# Patient Record
Sex: Female | Born: 1935 | Race: White | Hispanic: No | Smoking: Former smoker
Health system: Southern US, Community
[De-identification: ages and names within clinical notes are randomized; demographics above are authoritative.]

## PROBLEM LIST (undated history)

## (undated) DIAGNOSIS — F0280 Dementia in other diseases classified elsewhere without behavioral disturbance: Secondary | ICD-10-CM

## (undated) DIAGNOSIS — G459 Transient cerebral ischemic attack, unspecified: Secondary | ICD-10-CM

## (undated) DIAGNOSIS — H269 Unspecified cataract: Secondary | ICD-10-CM

## (undated) DIAGNOSIS — G309 Alzheimer's disease, unspecified: Secondary | ICD-10-CM

## (undated) HISTORY — PX: EYE SURGERY: SHX253

## (undated) HISTORY — PX: ABDOMINAL HYSTERECTOMY: SHX81

---

## 2014-12-13 ENCOUNTER — Encounter (HOSPITAL_COMMUNITY): Payer: Self-pay

## 2014-12-13 ENCOUNTER — Emergency Department (HOSPITAL_COMMUNITY): Payer: Medicare Other

## 2014-12-13 ENCOUNTER — Observation Stay (HOSPITAL_COMMUNITY)
Admission: EM | Admit: 2014-12-13 | Discharge: 2014-12-14 | Disposition: A | Discharge status: Home / Self Care | Payer: Medicare Other | Attending: Internal Medicine | Admitting: Internal Medicine

## 2014-12-13 DIAGNOSIS — Z66 Do not resuscitate: Secondary | ICD-10-CM | POA: Diagnosis not present

## 2014-12-13 DIAGNOSIS — Z87891 Personal history of nicotine dependence: Secondary | ICD-10-CM | POA: Insufficient documentation

## 2014-12-13 DIAGNOSIS — F0280 Dementia in other diseases classified elsewhere without behavioral disturbance: Secondary | ICD-10-CM | POA: Diagnosis not present

## 2014-12-13 DIAGNOSIS — Z8673 Personal history of transient ischemic attack (TIA), and cerebral infarction without residual deficits: Secondary | ICD-10-CM | POA: Diagnosis not present

## 2014-12-13 DIAGNOSIS — G309 Alzheimer's disease, unspecified: Principal | ICD-10-CM | POA: Insufficient documentation

## 2014-12-13 DIAGNOSIS — R4781 Slurred speech: Secondary | ICD-10-CM

## 2014-12-13 DIAGNOSIS — F0390 Unspecified dementia without behavioral disturbance: Secondary | ICD-10-CM | POA: Diagnosis present

## 2014-12-13 HISTORY — DX: Dementia in other diseases classified elsewhere, unspecified severity, without behavioral disturbance, psychotic disturbance, mood disturbance, and anxiety: F02.80

## 2014-12-13 HISTORY — DX: Alzheimer's disease, unspecified: G30.9

## 2014-12-13 HISTORY — DX: Transient cerebral ischemic attack, unspecified: G45.9

## 2014-12-13 HISTORY — DX: Unspecified cataract: H26.9

## 2014-12-13 LAB — URINALYSIS, ROUTINE W REFLEX MICROSCOPIC
BILIRUBIN URINE: NEGATIVE
Glucose, UA: NEGATIVE mg/dL
Hgb urine dipstick: NEGATIVE
Ketones, ur: NEGATIVE mg/dL
NITRITE: NEGATIVE
PROTEIN: NEGATIVE mg/dL
Specific Gravity, Urine: 1.003 — ABNORMAL LOW (ref 1.005–1.030)
UROBILINOGEN UA: 0.2 mg/dL (ref 0.0–1.0)
pH: 7 (ref 5.0–8.0)

## 2014-12-13 LAB — URINE MICROSCOPIC-ADD ON

## 2014-12-13 LAB — CBG MONITORING, ED
Glucose-Capillary: 123 mg/dL — ABNORMAL HIGH (ref 65–99)
Glucose-Capillary: 85 mg/dL (ref 65–99)

## 2014-12-13 NOTE — ED Notes (Addendum)
[facility] brings pt in bc daughter called 911 d/t slurred speech and pt stating her "head feeling funny."  [facility] took FSBG and it was 57, gave a half an amp of D50 and her sugar came up to 210 and her speech became much more clear.  Per daughter, her speech is still not clear completely.  Pt is alert to place and person, has alzheimer's, grip strength equal on both sides, sensation intact and equal on both and no facial droop noted.  Pt is slow to answer direct questions, but follows commands easily and readily makes jokes.  Per daughter, pt has been "off" for the past few days and has been more tired.

## 2014-12-14 ENCOUNTER — Inpatient Hospital Stay (HOSPITAL_COMMUNITY): Payer: Medicare Other

## 2014-12-14 ENCOUNTER — Encounter (HOSPITAL_COMMUNITY): Payer: Self-pay | Admitting: Internal Medicine

## 2014-12-14 DIAGNOSIS — R4781 Slurred speech: Secondary | ICD-10-CM | POA: Diagnosis not present

## 2014-12-14 DIAGNOSIS — F0391 Unspecified dementia with behavioral disturbance: Secondary | ICD-10-CM

## 2014-12-14 DIAGNOSIS — F0390 Unspecified dementia without behavioral disturbance: Secondary | ICD-10-CM | POA: Diagnosis present

## 2014-12-14 DIAGNOSIS — G309 Alzheimer's disease, unspecified: Secondary | ICD-10-CM | POA: Diagnosis not present

## 2014-12-14 LAB — GLUCOSE, CAPILLARY
GLUCOSE-CAPILLARY: 97 mg/dL (ref 65–99)
GLUCOSE-CAPILLARY: 116 mg/dL — AB (ref 65–99)
Glucose-Capillary: 78 mg/dL (ref 65–99)

## 2014-12-14 LAB — CBC WITH DIFFERENTIAL/PLATELET
BASOS PCT: 0 % (ref 0–1)
Basophils Absolute: 0 10*3/uL (ref 0.0–0.1)
EOS ABS: 0.4 10*3/uL (ref 0.0–0.7)
Eosinophils Relative: 5 % (ref 0–5)
HCT: 39.2 % (ref 36.0–46.0)
HEMOGLOBIN: 12.3 g/dL (ref 12.0–15.0)
LYMPHS ABS: 2.5 10*3/uL (ref 0.7–4.0)
Lymphocytes Relative: 29 % (ref 12–46)
MCH: 27.2 pg (ref 26.0–34.0)
MCHC: 31.4 g/dL (ref 30.0–36.0)
MCV: 86.7 fL (ref 78.0–100.0)
Monocytes Absolute: 0.9 10*3/uL (ref 0.1–1.0)
Monocytes Relative: 11 % (ref 3–12)
NEUTROS ABS: 4.6 10*3/uL (ref 1.7–7.7)
NEUTROS PCT: 55 % (ref 43–77)
Platelets: 248 10*3/uL (ref 150–400)
RBC: 4.52 MIL/uL (ref 3.87–5.11)
RDW: 14.4 % (ref 11.5–15.5)
WBC: 8.5 10*3/uL (ref 4.0–10.5)

## 2014-12-14 LAB — CREATININE, SERUM
Creatinine, Ser: 0.73 mg/dL (ref 0.44–1.00)
GFR calc non Af Amer: >60 mL/min (ref >60)

## 2014-12-14 LAB — CBC
HCT: 42.2 % (ref 36.0–46.0)
Hemoglobin: 13.2 g/dL (ref 12.0–15.0)
MCH: 27.3 pg (ref 26.0–34.0)
MCHC: 31.3 g/dL (ref 30.0–36.0)
MCV: 87.4 fL (ref 78.0–100.0)
PLATELETS: 283 10*3/uL (ref 150–400)
RBC: 4.83 MIL/uL (ref 3.87–5.11)
RDW: 14.4 % (ref 11.5–15.5)
WBC: 8 10*3/uL (ref 4.0–10.5)

## 2014-12-14 LAB — AMMONIA: AMMONIA: 45 umol/L — AB (ref 9–35)

## 2014-12-14 LAB — BASIC METABOLIC PANEL
ANION GAP: 7 (ref 5–15)
BUN: 5 mg/dL — ABNORMAL LOW (ref 6–20)
CALCIUM: 8.9 mg/dL (ref 8.9–10.3)
CO2: 28 mmol/L (ref 22–32)
CREATININE: 0.74 mg/dL (ref 0.44–1.00)
Chloride: 104 mmol/L (ref 101–111)
GFR calc Af Amer: >60 mL/min (ref >60)
Glucose, Bld: 100 mg/dL — ABNORMAL HIGH (ref 65–99)
Potassium: 3.9 mmol/L (ref 3.5–5.1)
Sodium: 139 mmol/L (ref 135–145)

## 2014-12-14 MED ORDER — GABAPENTIN 100 MG PO CAPS
100.0000 mg | ORAL_CAPSULE | Freq: Two times a day (BID) | ORAL | Status: DC
  Administered 2014-12-14: 100 mg via ORAL
  Filled 2014-12-14: qty 1

## 2014-12-14 MED ORDER — ASPIRIN EC 81 MG PO TBEC
81.0000 mg | DELAYED_RELEASE_TABLET | Freq: Every day | ORAL | Status: DC
  Administered 2014-12-14: 81 mg via ORAL
  Filled 2014-12-14: qty 1

## 2014-12-14 MED ORDER — DIPHENHYDRAMINE HCL 50 MG/ML IJ SOLN
25.0000 mg | Freq: Once | INTRAMUSCULAR | Status: AC
  Administered 2014-12-14: 25 mg via INTRAVENOUS
  Filled 2014-12-14: qty 1

## 2014-12-14 MED ORDER — ENOXAPARIN SODIUM 40 MG/0.4ML ~~LOC~~ SOLN
40.0000 mg | Freq: Every day | SUBCUTANEOUS | Status: DC
  Administered 2014-12-14: 40 mg via SUBCUTANEOUS
  Filled 2014-12-14: qty 0.4

## 2014-12-14 MED ORDER — SODIUM CHLORIDE 0.9 % IV SOLN
INTRAVENOUS | Status: DC
  Administered 2014-12-14: 04:00:00 via INTRAVENOUS

## 2014-12-14 MED ORDER — CITALOPRAM HYDROBROMIDE 10 MG PO TABS
10.0000 mg | ORAL_TABLET | Freq: Every day | ORAL | Status: DC
  Administered 2014-12-14: 10 mg via ORAL
  Filled 2014-12-14: qty 1

## 2014-12-14 NOTE — Progress Notes (Signed)
Pt arrived to 4N10. Transferred to bed no issues. Placed on tele box 4N10. Vitals stable. Will continue to monitor.

## 2014-12-14 NOTE — Progress Notes (Signed)
Pt discharging at this time with her daughter taking all personal belongings. IV discontinued, dry dressing applied. Discharge instructions provided with verbal understanding. Her daughter has already scheduled follow up appt. No noted distress. Pt denies pain or discomfort.

## 2014-12-14 NOTE — Discharge Summary (Signed)
Physician Discharge Summary  Kodi Guerrera ZOX:[id number] DOB: [birth year] DOA: 12/13/2014  PCP: Ginette Otto, MD  Admit date: 12/13/2014 Discharge date: 12/14/2014  Recommendations for Outpatient Follow-up:  1.   Discharge Diagnoses:  Principal Problem:   Slurred speech Active Problems:   Dementia   Discharge Condition: stable  Diet recommendation:   Filed Weights   12/13/14 2202 12/14/14 0352  Weight: 60.328 kg (133 lb) 58.196 kg (128 lb 4.8 oz)    History of present illness:  81 y.o. female with history of advanced dementia was brought to the ER after patient was found to be having slurred speech. As per patient's daughter who provided the history patient was started on Seroquel yesterday by patient's primary care physician of the patient was found to have some agitation. Patient was given the first dose of Seroquel and 7:30 PM and by 8:30 PM patient started developing slurred speech and confusion more than baseline. EMS was called and patient was found to have blood sugar in the 50s and was given D50 despite which patient still had slurred speech and was brought to the ER. CT of the head did not show anything acute. Patient has been admitted for further management. On my exam patient is alert awake oriented to her name. Moves all extremities.   Hospital Course:  Observed on telemetry. Blood glucoses remained normal. seroquel stopped. Suspect symptoms related to seroquel and or hypoglycemia. No history of diabetes.  Procedures:  none  Consultations:  none  Discharge Exam: Filed Vitals:   12/14/14 0600  BP: 128/54  Pulse: 72  Temp: 98.3 F (36.8 C)  Resp: 18    General: a and o, cooperative Cardiovascular: RRR Respiratory: CTA Neuro nonfocal  Discharge Instructions   Discharge Instructions    Diet general    Complete by:  As directed      Walk with assistance    Complete by:  As directed           Current Discharge Medication List    CONTINUE these  medications which have NOT CHANGED   Details  aspirin EC 81 MG tablet Take 81 mg by mouth daily.    citalopram (CELEXA) 10 MG tablet Take 10 mg by mouth daily.    gabapentin (NEURONTIN) 100 MG capsule Take 100 mg by mouth 2 (two) times daily.      STOP taking these medications     QUEtiapine (SEROQUEL) 25 MG tablet        No Known Allergies    The results of significant diagnostics from this hospitalization (including imaging, microbiology, ancillary and laboratory) are listed below for reference.    Significant Diagnostic Studies: Dg Chest 2 View  12/13/2014   CLINICAL DATA:  Altered mental status.  Hypoglycemia.  EXAM: CHEST  2 VIEW  COMPARISON:  None.  FINDINGS: Cardiac enlargement without vascular congestion. Diffuse interstitial pattern to the lungs likely represent fibrosis and chronic bronchitic change. No focal airspace disease or consolidation. No blunting of costophrenic angles. No pneumothorax. Calcified and tortuous aorta. Degenerative changes in the spine and shoulders.  IMPRESSION: Cardiac enlargement. Fibrosis and chronic bronchitic changes in the lungs. No evidence of active disease.   Electronically Signed   By: Burman Nieves M.D.   On: 12/13/2014 23:49   Ct Head Wo Contrast  12/14/2014   CLINICAL DATA:  Acute onset of slurred speech. Head feeling funny. Initial encounter.  EXAM: CT HEAD WITHOUT CONTRAST  TECHNIQUE: Contiguous axial images were obtained from the base of the skull  through the vertex without intravenous contrast.  COMPARISON:  None.  FINDINGS: There is no evidence of acute infarction, mass lesion, or intra- or extra-axial hemorrhage on CT.  Prominence of the ventricles and sulci reflects moderately severe cortical volume loss. Diffuse periventricular and subcortical white matter change reflects small vessel ischemic microangiopathy. Cerebellar atrophy is noted. Chronic ischemic change is noted at the external capsule bilaterally, and a few chronic lacunar  infarcts are noted at the basal ganglia.  The brainstem and fourth ventricle are within normal limits. The cerebral hemispheres demonstrate grossly normal gray-white differentiation. No mass effect or midline shift is seen.  There is no evidence of fracture; visualized osseous structures are unremarkable in appearance. The orbits are within normal limits. The paranasal sinuses and mastoid air cells are well-aerated. No significant soft tissue abnormalities are seen.  IMPRESSION: 1. No acute intracranial pathology seen on CT. 2. Moderately severe cortical volume loss noted. Diffuse small vessel ischemic microangiopathy seen. 3. Chronic ischemic change at the external capsule bilaterally, and few chronic lacunar infarcts at the basal ganglia.   Electronically Signed   By: Roanna Raider M.D.   On: 12/14/2014 00:04   Mr Brain Wo Contrast  12/14/2014   CLINICAL DATA:  Altered mental status for a few days, tired. Hypoglycemia. History of Alzheimer's.  EXAM: MRI HEAD WITHOUT CONTRAST  TECHNIQUE: Multiplanar, multiecho pulse sequences of the brain and surrounding structures were obtained without intravenous contrast.  COMPARISON:  CT head December 13, 2014  FINDINGS: No reduced diffusion to suggest acute ischemia. No susceptibility artifact to suggest hemorrhage.  Ventricles and sulci are normal for patient's age. Confluent supratentorial white matter FLAIR T2 hyperintensities, patchy pontine T2 hyperintensities without midline shift, mass effect or mass lesions on this mildly motion degraded examination. Tiny T2 hyperintensities in the basal ganglia and thalamus favor perivascular spaces.  Symmetric mildly prominent posterior fossa extra-axial cerebral spinal fluid spaces suggests underlying atrophy, less likely chronic subdural hematoma/hygromas. Normal major intracranial vascular flow voids seen at the skull base. Status post bilateral ocular lens implants. Trace maxillary sinus mucosal thickening without air-fluid  levels. The mastoid air cells are well aerated. Moderate temporomandibular osteoarthrosis. No abnormal sellar expansion. No cerebellar tonsillar ectopia. No suspicious calvarial bone marrow signal.  IMPRESSION: No acute intracranial process on this mildly motion degraded examination.  Involutional changes. Severe white matter changes compatible chronic small vessel ischemic disease .   Electronically Signed   By: Awilda Metro M.D.   On: 12/14/2014 03:53    Microbiology: No results found for this or any previous visit (from the past 240 hour(s)).   Labs: Basic Metabolic Panel:  Recent Labs Lab 12/13/14 2350  NA 139  K 3.9  CL 104  CO2 28  GLUCOSE 100*  BUN 5*  CREATININE 0.74  CALCIUM 8.9   Liver Function Tests: No results for input(s): AST, ALT, ALKPHOS, BILITOT, PROT, ALBUMIN in the last 168 hours. No results for input(s): LIPASE, AMYLASE in the last 168 hours. No results for input(s): AMMONIA in the last 168 hours. CBC:  Recent Labs Lab 12/13/14 2350  WBC 8.5  NEUTROABS 4.6  HGB 12.3  HCT 39.2  MCV 86.7  PLT 248   Cardiac Enzymes: No results for input(s): CKTOTAL, CKMB, CKMBINDEX, TROPONINI in the last 168 hours. BNP: BNP (last 3 results) No results for input(s): BNP in the last 8760 hours.  ProBNP (last 3 results) No results for input(s): PROBNP in the last 8760 hours.  CBG:  Recent Labs Lab  12/13/14 2211 12/13/14 2352 12/14/14 0412 12/14/14 0728  GLUCAP 123* 85 78 97       Signed:  Macie Baum L  [facility] 12/14/2014, 9:33 AM

## 2014-12-14 NOTE — ED Provider Notes (Signed)
CSN: [id number]     Arrival date & time 12/13/14  2139 History   First MD Initiated Contact with Patient 12/13/14 2203     Chief Complaint  Patient presents with  . Altered Mental Status  . Hypoglycemia     (Consider location/radiation/quality/duration/timing/severity/associated sxs/prior Treatment) Patient is a 81 y.o. female presenting with altered mental status and hypoglycemia. The history is provided by the patient and a relative. No language interpreter was used.  Altered Mental Status Presenting symptoms: behavior changes   Presenting symptoms: no confusion   Presenting symptoms comment:  Slurred speech Severity:  Moderate Most recent episode:  Today Episode history:  Single Duration:  5 hours Timing:  Constant Progression:  Unchanged Context: dementia   Associated symptoms: decreased appetite and slurred speech   Associated symptoms: no abdominal pain, no agitation, no fever, no headaches, no nausea, no palpitations, no vomiting and no weakness   Associated symptoms comment:  Increased sleepiness Hypoglycemia Initial blood sugar:  57 Blood sugar after intervention:  210 Severity:  Moderate Onset quality:  Unable to specify Progression:  Improving Chronicity:  New Diabetic status:  Non-diabetic Relieved by:  IV glucose Associated symptoms: altered mental status and speech difficulty   Associated symptoms: no shortness of breath, no sweats, no vomiting and no weakness     Past Medical History  Diagnosis Date  . Alzheimer disease   . TIA (transient ischemic attack)   . Cataract    Past Surgical History  Procedure Laterality Date  . Abdominal hysterectomy    . Eye surgery     Family History  Problem Relation Age of Onset  . Dementia Neg Hx    History  Substance Use Topics  . Smoking status: Former Games developer  . Smokeless tobacco: Not on file  . Alcohol Use: Not on file   OB History    No data available     Review of Systems  Constitutional: Positive  for decreased appetite. Negative for fever, chills, diaphoresis, activity change, appetite change and fatigue.  HENT: Negative for congestion, facial swelling, rhinorrhea and sore throat.   Eyes: Negative for photophobia and discharge.  Respiratory: Negative for cough, chest tightness and shortness of breath.   Cardiovascular: Negative for chest pain, palpitations and leg swelling.  Gastrointestinal: Negative for nausea, vomiting, abdominal pain and diarrhea.  Endocrine: Negative for polydipsia and polyuria.  Genitourinary: Negative for dysuria, frequency, difficulty urinating and pelvic pain.  Musculoskeletal: Negative for back pain, arthralgias, neck pain and neck stiffness.  Skin: Negative for color change and wound.  Allergic/Immunologic: Negative for immunocompromised state.  Neurological: Positive for speech difficulty. Negative for facial asymmetry, weakness, numbness and headaches.  Hematological: Does not bruise/bleed easily.  Psychiatric/Behavioral: Negative for confusion and agitation.      Allergies  Review of patient's allergies indicates no known allergies.  Home Medications   Prior to Admission medications   Medication Sig Start Date End Date Taking? Authorizing Provider  aspirin EC 81 MG tablet Take 81 mg by mouth daily.   Yes Historical Provider, MD  citalopram (CELEXA) 10 MG tablet Take 10 mg by mouth daily.   Yes Historical Provider, MD  gabapentin (NEURONTIN) 100 MG capsule Take 100 mg by mouth 2 (two) times daily.   Yes Historical Provider, MD  QUEtiapine (SEROQUEL) 25 MG tablet Take 25 mg by mouth at bedtime.   Yes Historical Provider, MD   BP 128/54 mmHg  Pulse 72  Temp(Src) 98.3 F (36.8 C) (Oral)  Resp 18  Ht  5\' 4"  (1.626 m)  Wt 128 lb 4.8 oz (58.196 kg)  BMI 22.01 kg/m2  SpO2 98% Physical Exam  Constitutional: She is oriented to person, place, and time. She appears well-developed and well-nourished. No distress.  HENT:  Head: Normocephalic and  atraumatic.  Mouth/Throat: No oropharyngeal exudate.  Eyes: Pupils are equal, round, and reactive to light.  Neck: Normal range of motion. Neck supple.  Cardiovascular: Normal rate, regular rhythm and normal heart sounds.  Exam reveals no gallop and no friction rub.   No murmur heard. Pulmonary/Chest: Effort normal and breath sounds normal. No respiratory distress. She has no wheezes. She has no rales.  Abdominal: Soft. Bowel sounds are normal. She exhibits no distension and no mass. There is no tenderness. There is no rebound and no guarding.  Musculoskeletal: Normal range of motion. She exhibits no edema or tenderness.  Neurological: She is alert and oriented to person, place, and time. She has normal strength. She displays no tremor. No cranial nerve deficit or sensory deficit. She exhibits normal muscle tone. Coordination normal. GCS eye subscore is 4. GCS verbal subscore is 4. GCS motor subscore is 6.  Skin: Skin is warm and dry.  Psychiatric: She has a normal mood and affect.    ED Course  Procedures (including critical care time) Labs Review Labs Reviewed  URINALYSIS, ROUTINE W REFLEX MICROSCOPIC (NOT AT [facility]) - Abnormal; Notable for the following:    APPearance CLOUDY (*)    Specific Gravity, Urine 1.003 (*)    Leukocytes, UA TRACE (*)    All other components within normal limits  URINE MICROSCOPIC-ADD ON - Abnormal; Notable for the following:    Bacteria, UA FEW (*)    All other components within normal limits  BASIC METABOLIC PANEL - Abnormal; Notable for the following:    Glucose, Bld 100 (*)    BUN 5 (*)    All other components within normal limits  AMMONIA - Abnormal; Notable for the following:    Ammonia 45 (*)    All other components within normal limits  GLUCOSE, CAPILLARY - Abnormal; Notable for the following:    Glucose-Capillary 116 (*)    All other components within normal limits  CBG MONITORING, ED - Abnormal; Notable for the following:    Glucose-Capillary  123 (*)    All other components within normal limits  URINE CULTURE  CBC WITH DIFFERENTIAL/PLATELET  CBC  CREATININE, SERUM  GLUCOSE, CAPILLARY  GLUCOSE, CAPILLARY  CBG MONITORING, ED    Imaging Review Dg Chest 2 View  12/13/2014   CLINICAL DATA:  Altered mental status.  Hypoglycemia.  EXAM: CHEST  2 VIEW  COMPARISON:  None.  FINDINGS: Cardiac enlargement without vascular congestion. Diffuse interstitial pattern to the lungs likely represent fibrosis and chronic bronchitic change. No focal airspace disease or consolidation. No blunting of costophrenic angles. No pneumothorax. Calcified and tortuous aorta. Degenerative changes in the spine and shoulders.  IMPRESSION: Cardiac enlargement. Fibrosis and chronic bronchitic changes in the lungs. No evidence of active disease.   Electronically Signed   By: Burman Nieves M.D.   On: 12/13/2014 23:49   Ct Head Wo Contrast  12/14/2014   CLINICAL DATA:  Acute onset of slurred speech. Head feeling funny. Initial encounter.  EXAM: CT HEAD WITHOUT CONTRAST  TECHNIQUE: Contiguous axial images were obtained from the base of the skull through the vertex without intravenous contrast.  COMPARISON:  None.  FINDINGS: There is no evidence of acute infarction, mass lesion, or intra- or  extra-axial hemorrhage on CT.  Prominence of the ventricles and sulci reflects moderately severe cortical volume loss. Diffuse periventricular and subcortical white matter change reflects small vessel ischemic microangiopathy. Cerebellar atrophy is noted. Chronic ischemic change is noted at the external capsule bilaterally, and a few chronic lacunar infarcts are noted at the basal ganglia.  The brainstem and fourth ventricle are within normal limits. The cerebral hemispheres demonstrate grossly normal gray-white differentiation. No mass effect or midline shift is seen.  There is no evidence of fracture; visualized osseous structures are unremarkable in appearance. The orbits are within  normal limits. The paranasal sinuses and mastoid air cells are well-aerated. No significant soft tissue abnormalities are seen.  IMPRESSION: 1. No acute intracranial pathology seen on CT. 2. Moderately severe cortical volume loss noted. Diffuse small vessel ischemic microangiopathy seen. 3. Chronic ischemic change at the external capsule bilaterally, and few chronic lacunar infarcts at the basal ganglia.   Electronically Signed   By: Roanna Raider M.D.   On: 12/14/2014 00:04   Mr Brain Wo Contrast  12/14/2014   CLINICAL DATA:  Altered mental status for a few days, tired. Hypoglycemia. History of Alzheimer's.  EXAM: MRI HEAD WITHOUT CONTRAST  TECHNIQUE: Multiplanar, multiecho pulse sequences of the brain and surrounding structures were obtained without intravenous contrast.  COMPARISON:  CT head December 13, 2014  FINDINGS: No reduced diffusion to suggest acute ischemia. No susceptibility artifact to suggest hemorrhage.  Ventricles and sulci are normal for patient's age. Confluent supratentorial white matter FLAIR T2 hyperintensities, patchy pontine T2 hyperintensities without midline shift, mass effect or mass lesions on this mildly motion degraded examination. Tiny T2 hyperintensities in the basal ganglia and thalamus favor perivascular spaces.  Symmetric mildly prominent posterior fossa extra-axial cerebral spinal fluid spaces suggests underlying atrophy, less likely chronic subdural hematoma/hygromas. Normal major intracranial vascular flow voids seen at the skull base. Status post bilateral ocular lens implants. Trace maxillary sinus mucosal thickening without air-fluid levels. The mastoid air cells are well aerated. Moderate temporomandibular osteoarthrosis. No abnormal sellar expansion. No cerebellar tonsillar ectopia. No suspicious calvarial bone marrow signal.  IMPRESSION: No acute intracranial process on this mildly motion degraded examination.  Involutional changes. Severe white matter changes compatible  chronic small vessel ischemic disease .   Electronically Signed   By: Awilda Metro M.D.   On: 12/14/2014 03:53     EKG Interpretation None      MDM   Final diagnoses:  Slurred speech    Pt is a 81 y.o. female with Pmhx as above who presents with sudden onset slurred speech around 8:30PM, EMS called, found to be hypoglycemic D50 was given, but per daughter.  Speech is unchanged.  She also reports that she has been sleepy for the last couple days but that the sleepiness was worse today.  On physical exam, vital signs are stable and she is in no acute distress.  She has no focal neuro findings.  CT had normal chest x-ray has no acute findings.  Urine does not appear infected.  CBC and BMP are grossly unremarkable.  Patient's glucose here is normal.  As I consider TIA/CVA a possibility given, slurred speech, tried consulted for admission for further workup.      Toy Cookey, MD 12/14/14 [id number]

## 2014-12-14 NOTE — H&P (Signed)
[facility] History and Physical  Janet Nunez ZOX:[id number] DOB: [birth year] DOA: 12/13/2014  Referring physician: Dr.Docherty. PCP: Ginette Otto, MD  Specialists: None.  Chief Complaint: Slurred speech.  HPI: Janet Nunez is a 81 y.o. female with history of advanced dementia was brought to the ER after patient was found to be having slurred speech. As per patient's daughter who provided the history patient was started on Seroquel yesterday by patient's primary care physician of the patient was found to have some agitation. Patient was given the first dose of Seroquel and 7:30 PM and by 8:30 PM patient started developing slurred speech and confusion more than baseline. EMS was called and patient was found to have blood sugar in the 50s and was given D50 despite which patient still had slurred speech and was brought to the ER. CT of the head did not show anything acute. Patient has been admitted for further management. On my exam patient is alert awake oriented to her name. Moves all extremities.   Review of Systems: As presented in the history of presenting illness, rest negative.  Past Medical History  Diagnosis Date  . Alzheimer disease   . TIA (transient ischemic attack)   . Cataract    Past Surgical History  Procedure Laterality Date  . Abdominal hysterectomy    . Eye surgery     Social History:  reports that she has quit smoking. She does not have any smokeless tobacco history on file. Her alcohol and drug histories are not on file. Where does patient live home. Can patient participate in ADLs? No.  No Known Allergies  Family History:  Family History  Problem Relation Age of Onset  . Dementia Neg Hx       Prior to Admission medications   Medication Sig Start Date End Date Taking? Authorizing Provider  aspirin EC 81 MG tablet Take 81 mg by mouth daily.   Yes Historical Provider, MD  citalopram (CELEXA) 10 MG tablet Take 10 mg by mouth daily.   Yes Historical  Provider, MD  gabapentin (NEURONTIN) 100 MG capsule Take 100 mg by mouth 2 (two) times daily.   Yes Historical Provider, MD  QUEtiapine (SEROQUEL) 25 MG tablet Take 25 mg by mouth at bedtime.   Yes Historical Provider, MD    Physical Exam: Filed Vitals:   12/14/14 0000 12/14/14 0030 12/14/14 0115 12/14/14 0145  BP: 124/52 120/51 133/49 137/52  Pulse: 63 68 66 68  Temp:      Resp: Height:      Weight:      SpO2: 96% 94% 94% 95%     General:  Moderately built and poorly nourished.  Eyes: Anicteric no pallor.  ENT: No discharge from the ears eyes nose and mouth.  Neck: No mass felt.  Cardiovascular: S1 and S2 heard.  Respiratory: No rhonchi or crepitations.  Abdomen: Soft nontender bowel sounds present.  Skin: No rash.  Musculoskeletal: No edema.  Psychiatric: Patient has dementia.  Neurologic: Alert awake oriented to name. Moves all extremities. No facial asymmetry. PERRLA positive.  Labs on Admission:  Basic Metabolic Panel:  Recent Labs Lab 12/13/14 2350  NA 139  K 3.9  CL 104  CO2 28  GLUCOSE 100*  BUN 5*  CREATININE 0.74  CALCIUM 8.9   Liver Function Tests: No results for input(s): AST, ALT, ALKPHOS, BILITOT, PROT, ALBUMIN in the last 168 hours. No results for input(s): LIPASE, AMYLASE in the last 168 hours. No results for  input(s): AMMONIA in the last 168 hours. CBC:  Recent Labs Lab 12/13/14 2350  WBC 8.5  NEUTROABS 4.6  HGB 12.3  HCT 39.2  MCV 86.7  PLT 248   Cardiac Enzymes: No results for input(s): CKTOTAL, CKMB, CKMBINDEX, TROPONINI in the last 168 hours.  BNP (last 3 results) No results for input(s): BNP in the last 8760 hours.  ProBNP (last 3 results) No results for input(s): PROBNP in the last 8760 hours.  CBG:  Recent Labs Lab 12/13/14 2211 12/13/14 2352  GLUCAP 123* 85    Radiological Exams on Admission: Dg Chest 2 View  12/13/2014   CLINICAL DATA:  Altered mental status.  Hypoglycemia.  EXAM: CHEST   2 VIEW  COMPARISON:  None.  FINDINGS: Cardiac enlargement without vascular congestion. Diffuse interstitial pattern to the lungs likely represent fibrosis and chronic bronchitic change. No focal airspace disease or consolidation. No blunting of costophrenic angles. No pneumothorax. Calcified and tortuous aorta. Degenerative changes in the spine and shoulders.  IMPRESSION: Cardiac enlargement. Fibrosis and chronic bronchitic changes in the lungs. No evidence of active disease.   Electronically Signed   By: Burman Nieves M.D.   On: 12/13/2014 23:49   Ct Head Wo Contrast  12/14/2014   CLINICAL DATA:  Acute onset of slurred speech. Head feeling funny. Initial encounter.  EXAM: CT HEAD WITHOUT CONTRAST  TECHNIQUE: Contiguous axial images were obtained from the base of the skull through the vertex without intravenous contrast.  COMPARISON:  None.  FINDINGS: There is no evidence of acute infarction, mass lesion, or intra- or extra-axial hemorrhage on CT.  Prominence of the ventricles and sulci reflects moderately severe cortical volume loss. Diffuse periventricular and subcortical white matter change reflects small vessel ischemic microangiopathy. Cerebellar atrophy is noted. Chronic ischemic change is noted at the external capsule bilaterally, and a few chronic lacunar infarcts are noted at the basal ganglia.  The brainstem and fourth ventricle are within normal limits. The cerebral hemispheres demonstrate grossly normal gray-white differentiation. No mass effect or midline shift is seen.  There is no evidence of fracture; visualized osseous structures are unremarkable in appearance. The orbits are within normal limits. The paranasal sinuses and mastoid air cells are well-aerated. No significant soft tissue abnormalities are seen.  IMPRESSION: 1. No acute intracranial pathology seen on CT. 2. Moderately severe cortical volume loss noted. Diffuse small vessel ischemic microangiopathy seen. 3. Chronic ischemic change  at the external capsule bilaterally, and few chronic lacunar infarcts at the basal ganglia.   Electronically Signed   By: Roanna Raider M.D.   On: 12/14/2014 00:04     Assessment/Plan Principal Problem:   Slurred speech Active Problems:   Dementia   1. Slurred speech - primarily I think patient's symptoms may be related to patient's Seroquel added yesterday. MRI is pending to rule out stroke. If MRI is negative for stroke then possibly will hold off Seroquel. At this time patient's daughter does not want any very aggressive measures. Patient did have brief episode of hypoglycemia which could also have contributed to the patient's symptoms. Closely follow CBGs. 2. Dementia - will continue with her present medications. See #1 regarding Seroquel.   DVT Prophylaxis Lovenox.  Code Status: DO NOT RESUSCITATE.  Family Communication: Discussed daughter.  Disposition Plan: Admit for observation.    Saskia Simerson N. [facility] Pager [phone].  If 7PM-7AM, please contact night-coverage [email or web address] Password [facility] 12/14/2014, 3:53 AM

## 2014-12-15 LAB — URINE CULTURE

## 2015-01-09 ENCOUNTER — Encounter (HOSPITAL_COMMUNITY): Payer: Self-pay

## 2015-01-09 ENCOUNTER — Inpatient Hospital Stay (HOSPITAL_COMMUNITY)
Admission: EM | Admit: 2015-01-09 | Discharge: 2015-01-12 | DRG: 179 | Disposition: A | Discharge status: Home Health | Payer: Medicare Other | Attending: Internal Medicine | Admitting: Internal Medicine

## 2015-01-09 DIAGNOSIS — R633 Feeding difficulties: Secondary | ICD-10-CM | POA: Diagnosis present

## 2015-01-09 DIAGNOSIS — R778 Other specified abnormalities of plasma proteins: Secondary | ICD-10-CM | POA: Diagnosis present

## 2015-01-09 DIAGNOSIS — J690 Pneumonitis due to inhalation of food and vomit: Principal | ICD-10-CM | POA: Diagnosis present

## 2015-01-09 DIAGNOSIS — G309 Alzheimer's disease, unspecified: Secondary | ICD-10-CM | POA: Diagnosis present

## 2015-01-09 DIAGNOSIS — Z66 Do not resuscitate: Secondary | ICD-10-CM | POA: Diagnosis present

## 2015-01-09 DIAGNOSIS — J189 Pneumonia, unspecified organism: Secondary | ICD-10-CM

## 2015-01-09 DIAGNOSIS — Z8673 Personal history of transient ischemic attack (TIA), and cerebral infarction without residual deficits: Secondary | ICD-10-CM

## 2015-01-09 DIAGNOSIS — I7143 Infrarenal abdominal aortic aneurysm, without rupture: Secondary | ICD-10-CM | POA: Insufficient documentation

## 2015-01-09 DIAGNOSIS — Z7982 Long term (current) use of aspirin: Secondary | ICD-10-CM

## 2015-01-09 DIAGNOSIS — F0390 Unspecified dementia without behavioral disturbance: Secondary | ICD-10-CM | POA: Diagnosis present

## 2015-01-09 DIAGNOSIS — R7989 Other specified abnormal findings of blood chemistry: Secondary | ICD-10-CM | POA: Diagnosis present

## 2015-01-09 DIAGNOSIS — Z87891 Personal history of nicotine dependence: Secondary | ICD-10-CM

## 2015-01-09 DIAGNOSIS — I714 Abdominal aortic aneurysm, without rupture: Secondary | ICD-10-CM | POA: Diagnosis present

## 2015-01-09 DIAGNOSIS — Z515 Encounter for palliative care: Secondary | ICD-10-CM | POA: Insufficient documentation

## 2015-01-09 DIAGNOSIS — F0280 Dementia in other diseases classified elsewhere without behavioral disturbance: Secondary | ICD-10-CM | POA: Diagnosis present

## 2015-01-09 DIAGNOSIS — R1011 Right upper quadrant pain: Secondary | ICD-10-CM | POA: Diagnosis not present

## 2015-01-09 MED ORDER — SODIUM CHLORIDE 0.9 % IV SOLN
INTRAVENOUS | Status: DC
  Administered 2015-01-10: via INTRAVENOUS

## 2015-01-09 NOTE — ED Provider Notes (Addendum)
TIME SEEN: 11:40 PM  CHIEF COMPLAINT: Abdominal pain  HPI: Pt is a 81 y.o. female with history of Alzheimer's dementia, TIAs who presents to the emergency department with her daughter for complaints of abdominal pain that started 1 hour prior to arrival. Given patient's dementia history is very limited. She does state that pain is worse with movement and deep breast but feels better when staying still. No known history of fever, cough, chest pain or shortness of breath, nausea, vomiting or diarrhea, dysuria or hematuria. Patient is status post hysterectomy.  ROS: See HPI Constitutional: no fever  Eyes: no drainage  ENT: no runny nose   Cardiovascular:  no chest pain  Resp: no SOB  GI: no vomiting GU: no dysuria Integumentary: no rash  Allergy: no hives  Musculoskeletal: no leg swelling  Neurological: no slurred speech ROS otherwise negative  PAST MEDICAL HISTORY/PAST SURGICAL HISTORY:  Past Medical History  Diagnosis Date  . Alzheimer disease   . TIA (transient ischemic attack)   . Cataract     MEDICATIONS:  Prior to Admission medications   Medication Sig Start Date End Date Taking? Authorizing Provider  aspirin EC 81 MG tablet Take 81 mg by mouth daily.    Historical Provider, MD  citalopram (CELEXA) 10 MG tablet Take 10 mg by mouth daily.    Historical Provider, MD  gabapentin (NEURONTIN) 100 MG capsule Take 100 mg by mouth 2 (two) times daily.    Historical Provider, MD    ALLERGIES:  No Known Allergies  SOCIAL HISTORY:  History  Substance Use Topics  . Smoking status: Former Games developer  . Smokeless tobacco: Not on file  . Alcohol Use: Not on file    FAMILY HISTORY: Family History  Problem Relation Age of Onset  . Dementia Neg Hx     EXAM: BP 173/64 mmHg  Pulse 90  Temp(Src) 98.9 F (37.2 C) (Oral)  Resp 18  SpO2 93% CONSTITUTIONAL: Alert and oriented to person and answers some questions appropriately but does appear to be confused which is her baseline, in  no distress, nontoxic, afebrile, elderly HEAD: Normocephalic EYES: Conjunctivae clear, PERRL ENT: normal nose; no rhinorrhea; moist mucous membranes; pharynx without lesions noted NECK: Supple, no meningismus, no LAD  CARD: RRR; S1 and S2 appreciated; no murmurs, no clicks, no rubs, no gallops RESP: Normal chest excursion without splinting or tachypnea; breath sounds clear and equal bilaterally; no wheezes, no rhonchi, no rales, no hypoxia or respiratory distress, speaking full sentences ABD/GI: Normal bowel sounds; non-distended; soft, tender to palpation throughout the abdomen but worse in the epigastric region and right mid abdomen, no rebound, no guarding, no peritoneal signs, no significant tenderness at McBurney's point, negative Murphy sign BACK:  The back appears normal and is non-tender to palpation, there is no CVA tenderness EXT: Normal ROM in all joints; non-tender to palpation; no edema; normal capillary refill; no cyanosis, no calf tenderness or swelling    SKIN: Normal color for age and race; warm NEURO: Moves all extremities equally, sensation to light touch intact diffusely, cranial nerves II through XII intact PSYCH: The patient's mood and manner are appropriate. Grooming and personal hygiene are appropriate.  MEDICAL DECISION MAKING: Patient here with abdominal pain. History limited given her dementia. I will obtain labs, urine, CT of her abdomen and pelvis. She denies any pain medication at this time.  ED PROGRESS: Patient's labs show troponin of 1.02 in the setting of a normal creatinine. No leukocytosis and normal lactate. Repeat EKG  shows no new changes. She does have abnormal morphology of the ST segment in lead III but no ST elevation and no other signs of ischemia. Discussed with Dr. Leeann MustJacob Kelly with cardiology. Patient is a DO NOT RESUSCITATE and daughter at bedside states at this time she is not sure if she would want a cardiac catheterization. Dr. Tresa EndoKelly feels patient can  be admitted to medical service and cardiology will consult in the morning. He feels that she likely has other underlying medical issues that are causing her troponin to be elevated. She denies chest pain or shortness of breath.  2:30 AM  Pt's CT scan shows a 4.6 cm infrarenal abdominal aortic aneurysm without sign of rupture. Daughter states that patient would not want anything done about this aneurysm. There is also a right pleural effusion and consolidation in the right lung base indicative of pneumonia. This is likely the cause of her right-sided pleuritic pain. Will treat with ceftriaxone and azithromycin. Will admit.  Daughter now reports for the past week the patient has had a cough and a hoarse voice. Discussed with hospitalist, Dr. Toniann FailKakrakandy for admission to tele.     EKG Interpretation  Date/Time:  Tuesday January 09 2015 23:59:15 EDT Ventricular Rate:  86 PR Interval:  212 QRS Duration: 107 QT Interval:  365 QTC Calculation: 436 R Axis:   -46 Text Interpretation:  Sinus rhythm Atrial premature complex Left anterior fascicular block Abnormal R-wave progression, early transition Left ventricular hypertrophy Baseline wander in lead(s) V3 V5 No old tracing to compare Confirmed by Kayd Launer,  DO, Rickiya Picariello [phone]([id number]) on 01/10/2015 12:21:15 AM       EKG Interpretation  Date/Time:  Wednesday January 10 2015 00:49:10 EDT Ventricular Rate:  79 PR Interval:  213 QRS Duration: 113 QT Interval:  370 QTC Calculation: 424 R Axis:   -45 Text Interpretation:  Sinus rhythm Borderline prolonged PR interval Left anterior fascicular block Abnormal R-wave progression, early transition Left ventricular hypertrophy ST elevation, consider inferior injury Confirmed by Envy Meno,  DO, Koichi Platte ([id number]) on 01/10/2015 1:18:56 AM          Layla MawKristen N Jowana Thumma, DO 01/10/15 0232    Daughter now states that she would not want anything done for patient other than treatment of her pneumonia. She would not want a cardiac  catheterization. She is a DO NOT RESUSCITATE/DO NOT INTUBATE. Will change bed to a medical bed.   Layla MawKristen N Christal Lagerstrom, DO 01/10/15 0236

## 2015-01-09 NOTE — ED Notes (Signed)
Pt complains of abdominal pain for one hour, tender to touch, more right upper quadrant pain

## 2015-01-09 NOTE — ED Notes (Signed)
Bed: [id number] Expected date:  Expected time:  Means of arrival:  Comments: EMS 81yo F abd pain

## 2015-01-10 ENCOUNTER — Emergency Department (HOSPITAL_COMMUNITY): Payer: Medicare Other

## 2015-01-10 ENCOUNTER — Encounter (HOSPITAL_COMMUNITY): Payer: Self-pay

## 2015-01-10 DIAGNOSIS — Z8673 Personal history of transient ischemic attack (TIA), and cerebral infarction without residual deficits: Secondary | ICD-10-CM | POA: Diagnosis not present

## 2015-01-10 DIAGNOSIS — Z87891 Personal history of nicotine dependence: Secondary | ICD-10-CM | POA: Diagnosis not present

## 2015-01-10 DIAGNOSIS — J690 Pneumonitis due to inhalation of food and vomit: Secondary | ICD-10-CM | POA: Diagnosis present

## 2015-01-10 DIAGNOSIS — J189 Pneumonia, unspecified organism: Secondary | ICD-10-CM | POA: Diagnosis not present

## 2015-01-10 DIAGNOSIS — G309 Alzheimer's disease, unspecified: Secondary | ICD-10-CM | POA: Diagnosis present

## 2015-01-10 DIAGNOSIS — R7989 Other specified abnormal findings of blood chemistry: Secondary | ICD-10-CM

## 2015-01-10 DIAGNOSIS — R633 Feeding difficulties: Secondary | ICD-10-CM | POA: Diagnosis present

## 2015-01-10 DIAGNOSIS — Z515 Encounter for palliative care: Secondary | ICD-10-CM | POA: Insufficient documentation

## 2015-01-10 DIAGNOSIS — I7143 Infrarenal abdominal aortic aneurysm, without rupture: Secondary | ICD-10-CM | POA: Insufficient documentation

## 2015-01-10 DIAGNOSIS — I714 Abdominal aortic aneurysm, without rupture: Secondary | ICD-10-CM | POA: Insufficient documentation

## 2015-01-10 DIAGNOSIS — F0390 Unspecified dementia without behavioral disturbance: Secondary | ICD-10-CM | POA: Diagnosis not present

## 2015-01-10 DIAGNOSIS — Z66 Do not resuscitate: Secondary | ICD-10-CM | POA: Diagnosis present

## 2015-01-10 DIAGNOSIS — Z7982 Long term (current) use of aspirin: Secondary | ICD-10-CM | POA: Diagnosis not present

## 2015-01-10 DIAGNOSIS — R778 Other specified abnormalities of plasma proteins: Secondary | ICD-10-CM | POA: Diagnosis present

## 2015-01-10 DIAGNOSIS — R1011 Right upper quadrant pain: Secondary | ICD-10-CM | POA: Diagnosis present

## 2015-01-10 DIAGNOSIS — F0280 Dementia in other diseases classified elsewhere without behavioral disturbance: Secondary | ICD-10-CM | POA: Diagnosis present

## 2015-01-10 LAB — CBC WITH DIFFERENTIAL/PLATELET
BASOS PCT: 0 % (ref 0–1)
Basophils Absolute: 0 10*3/uL (ref 0.0–0.1)
EOS PCT: 4 % (ref 0–5)
Eosinophils Absolute: 0.4 10*3/uL (ref 0.0–0.7)
HEMATOCRIT: 38.1 % (ref 36.0–46.0)
HEMOGLOBIN: 11.6 g/dL — AB (ref 12.0–15.0)
LYMPHS ABS: 2.5 10*3/uL (ref 0.7–4.0)
Lymphocytes Relative: 25 % (ref 12–46)
MCH: 26.7 pg (ref 26.0–34.0)
MCHC: 30.4 g/dL (ref 30.0–36.0)
MCV: 87.6 fL (ref 78.0–100.0)
MONO ABS: 1.2 10*3/uL — AB (ref 0.1–1.0)
Monocytes Relative: 12 % (ref 3–12)
NEUTROS PCT: 59 % (ref 43–77)
Neutro Abs: 5.9 10*3/uL (ref 1.7–7.7)
Platelets: 281 10*3/uL (ref 150–400)
RBC: 4.35 MIL/uL (ref 3.87–5.11)
RDW: 14.5 % (ref 11.5–15.5)
WBC: 9.9 10*3/uL (ref 4.0–10.5)

## 2015-01-10 LAB — LIPASE, BLOOD: LIPASE: 30 U/L (ref 22–51)

## 2015-01-10 LAB — CBC
HEMATOCRIT: 37.7 % (ref 36.0–46.0)
Hemoglobin: 11.7 g/dL — ABNORMAL LOW (ref 12.0–15.0)
MCH: 27 pg (ref 26.0–34.0)
MCHC: 31 g/dL (ref 30.0–36.0)
MCV: 87.1 fL (ref 78.0–100.0)
Platelets: 279 10*3/uL (ref 150–400)
RBC: 4.33 MIL/uL (ref 3.87–5.11)
RDW: 14.5 % (ref 11.5–15.5)
WBC: 10 10*3/uL (ref 4.0–10.5)

## 2015-01-10 LAB — I-STAT TROPONIN, ED: Troponin i, poc: 1.02 ng/mL (ref 0.00–0.08)

## 2015-01-10 LAB — URINALYSIS, ROUTINE W REFLEX MICROSCOPIC
BILIRUBIN URINE: NEGATIVE
GLUCOSE, UA: NEGATIVE mg/dL
HGB URINE DIPSTICK: NEGATIVE
KETONES UR: NEGATIVE mg/dL
Nitrite: NEGATIVE
PH: 7.5 (ref 5.0–8.0)
Protein, ur: NEGATIVE mg/dL
Specific Gravity, Urine: 1.01 (ref 1.005–1.030)
UROBILINOGEN UA: 0.2 mg/dL (ref 0.0–1.0)

## 2015-01-10 LAB — CREATININE, SERUM
CREATININE: 0.71 mg/dL (ref 0.44–1.00)
GFR calc Af Amer: >60 mL/min (ref >60)

## 2015-01-10 LAB — COMPREHENSIVE METABOLIC PANEL
ALBUMIN: 3.1 g/dL — AB (ref 3.5–5.0)
ALT: 9 U/L — AB (ref 14–54)
AST: 16 U/L (ref 15–41)
Alkaline Phosphatase: 109 U/L (ref 38–126)
Anion gap: 9 (ref 5–15)
BUN: 14 mg/dL (ref 6–20)
CALCIUM: 8.6 mg/dL — AB (ref 8.9–10.3)
CHLORIDE: 101 mmol/L (ref 101–111)
CO2: 29 mmol/L (ref 22–32)
CREATININE: 0.81 mg/dL (ref 0.44–1.00)
GFR calc Af Amer: >60 mL/min (ref >60)
GFR calc non Af Amer: >60 mL/min (ref >60)
Glucose, Bld: 118 mg/dL — ABNORMAL HIGH (ref 65–99)
Potassium: 3.5 mmol/L (ref 3.5–5.1)
Sodium: 139 mmol/L (ref 135–145)
Total Bilirubin: 0.1 mg/dL — ABNORMAL LOW (ref 0.3–1.2)
Total Protein: 6.5 g/dL (ref 6.5–8.1)

## 2015-01-10 LAB — URINE MICROSCOPIC-ADD ON

## 2015-01-10 LAB — I-STAT CG4 LACTIC ACID, ED: Lactic Acid, Venous: 0.84 mmol/L (ref 0.5–2.0)

## 2015-01-10 MED ORDER — ONDANSETRON HCL 4 MG PO TABS: 4.0000 mg | ORAL_TABLET | Freq: Four times a day (QID) | ORAL | Status: DC | PRN

## 2015-01-10 MED ORDER — ENOXAPARIN SODIUM 40 MG/0.4ML ~~LOC~~ SOLN
40.0000 mg | SUBCUTANEOUS | Status: DC
  Administered 2015-01-10 – 2015-01-11 (×2): 40 mg via SUBCUTANEOUS
  Filled 2015-01-10 (×3): qty 0.4

## 2015-01-10 MED ORDER — IOHEXOL 300 MG/ML  SOLN
100.0000 mL | Freq: Once | INTRAMUSCULAR | Status: AC | PRN
  Administered 2015-01-10: 100 mL via INTRAVENOUS

## 2015-01-10 MED ORDER — IOHEXOL 300 MG/ML  SOLN
50.0000 mL | Freq: Once | INTRAMUSCULAR | Status: AC | PRN
  Administered 2015-01-10: 50 mL via ORAL

## 2015-01-10 MED ORDER — DEXTROSE 5 % IV SOLN
500.0000 mg | Freq: Once | INTRAVENOUS | Status: AC
  Administered 2015-01-10: 04:00:00 via INTRAVENOUS
  Filled 2015-01-10: qty 500

## 2015-01-10 MED ORDER — GABAPENTIN 100 MG PO CAPS
100.0000 mg | ORAL_CAPSULE | Freq: Two times a day (BID) | ORAL | Status: DC
  Administered 2015-01-10 – 2015-01-12 (×5): 100 mg via ORAL
  Filled 2015-01-10 (×6): qty 1

## 2015-01-10 MED ORDER — SODIUM CHLORIDE 0.9 % IV SOLN
3.0000 g | Freq: Four times a day (QID) | INTRAVENOUS | Status: DC
  Administered 2015-01-10 – 2015-01-12 (×8): 3 g via INTRAVENOUS
  Filled 2015-01-10 (×9): qty 3

## 2015-01-10 MED ORDER — DEXTROSE 5 % IV SOLN
1.0000 g | Freq: Once | INTRAVENOUS | Status: AC
  Administered 2015-01-10: 03:00:00 via INTRAVENOUS
  Filled 2015-01-10: qty 10

## 2015-01-10 MED ORDER — ASPIRIN 81 MG PO CHEW
324.0000 mg | CHEWABLE_TABLET | Freq: Once | ORAL | Status: AC
  Administered 2015-01-10: 324 mg via ORAL
  Filled 2015-01-10: qty 4

## 2015-01-10 MED ORDER — DEXTROSE 5 % IV SOLN: 500.0000 mg | INTRAVENOUS | Status: DC

## 2015-01-10 MED ORDER — SODIUM CHLORIDE 0.9 % IV SOLN
INTRAVENOUS | Status: DC
  Administered 2015-01-11: 02:00:00 via INTRAVENOUS

## 2015-01-10 MED ORDER — ONDANSETRON HCL 4 MG/2ML IJ SOLN: 4.0000 mg | Freq: Four times a day (QID) | INTRAMUSCULAR | Status: DC | PRN

## 2015-01-10 MED ORDER — DEXTROSE 5 % IV SOLN
500.0000 mg | INTRAVENOUS | Status: DC
  Filled 2015-01-10: qty 500

## 2015-01-10 MED ORDER — ASPIRIN EC 81 MG PO TBEC
81.0000 mg | DELAYED_RELEASE_TABLET | Freq: Every day | ORAL | Status: DC
  Administered 2015-01-10 – 2015-01-12 (×3): 81 mg via ORAL
  Filled 2015-01-10 (×3): qty 1

## 2015-01-10 MED ORDER — CITALOPRAM HYDROBROMIDE 10 MG PO TABS
10.0000 mg | ORAL_TABLET | Freq: Every day | ORAL | Status: DC
  Administered 2015-01-10 – 2015-01-12 (×3): 10 mg via ORAL
  Filled 2015-01-10 (×3): qty 1

## 2015-01-10 MED ORDER — ACETAMINOPHEN 650 MG RE SUPP: 650.0000 mg | Freq: Four times a day (QID) | RECTAL | Status: DC | PRN

## 2015-01-10 MED ORDER — ENSURE ENLIVE PO LIQD
237.0000 mL | Freq: Two times a day (BID) | ORAL | Status: DC
  Administered 2015-01-10 – 2015-01-11 (×4): 237 mL via ORAL

## 2015-01-10 MED ORDER — CEFTRIAXONE SODIUM IN DEXTROSE 40 MG/ML IV SOLN
2.0000 g | INTRAVENOUS | Status: DC
  Administered 2015-01-10: 2 g via INTRAVENOUS
  Filled 2015-01-10: qty 50

## 2015-01-10 MED ORDER — ACETAMINOPHEN 325 MG PO TABS: 650.0000 mg | ORAL_TABLET | Freq: Four times a day (QID) | ORAL | Status: DC | PRN

## 2015-01-10 MED ORDER — TRAZODONE 25 MG HALF TABLET
25.0000 mg | ORAL_TABLET | Freq: Every day | ORAL | Status: DC
  Administered 2015-01-10 – 2015-01-11 (×2): 25 mg via ORAL
  Filled 2015-01-10 (×3): qty 1

## 2015-01-10 NOTE — Progress Notes (Signed)
ANTIBIOTIC CONSULT NOTE - INITIAL  Pharmacy Consult for Ceftriaxone Indication: pneumonia  Allergies  Allergen Reactions  . Seroquel [Quetiapine] Other (See Comments)    "Presented like a stroke"    Patient Measurements: Height: 5\' 3"  (160 cm) Weight: 128 lb 1.4 oz (58.1 kg) IBW/kg (Calculated) : 52.4 Adjusted Body Weight:   Vital Signs: Temp: 98.2 F (36.8 C) (07/13 0426) Temp Source: Oral (07/13 0426) BP: 134/64 mmHg (07/13 0426) Pulse Rate: 79 (07/13 0426) Intake/Output from previous day: 07/12 0701 - 07/13 0700 In: 285 [I.V.:285] Out: 300 [Urine:300] Intake/Output from this shift: Total I/O In: 285 [I.V.:285] Out: 300 [Urine:300]  Labs:  Recent Labs  01/10/15 0011 01/10/15 0426  WBC 9.9 10.0  HGB 11.6* 11.7*  PLT 281 279  CREATININE 0.81 0.71   Estimated Creatinine Clearance: 37.1 mL/min (by C-G formula based on Cr of 0.71). No results for input(s): VANCOTROUGH, VANCOPEAK, VANCORANDOM, GENTTROUGH, GENTPEAK, GENTRANDOM, TOBRATROUGH, TOBRAPEAK, TOBRARND, AMIKACINPEAK, AMIKACINTROU, AMIKACIN in the last 72 hours.   Microbiology: Recent Results (from the past 720 hour(s))  Urine culture     Status: None   Collection Time: 12/13/14 10:10 PM  Result Value Ref Range Status   Specimen Description URINE, RANDOM  Final   Special Requests NONE  Final   Culture   Final    MULTIPLE SPECIES PRESENT, SUGGEST RECOLLECTION IF CLINICALLY INDICATED   Report Status 12/15/2014 FINAL  Final    Medical History: Past Medical History  Diagnosis Date  . Alzheimer disease   . TIA (transient ischemic attack)   . Cataract     Medications:  Anti-infectives    Start     Dose/Rate Route Frequency Ordered Stop   01/10/15 2200  azithromycin (ZITHROMAX) 500 mg in dextrose 5 % 250 mL IVPB     500 mg 250 mL/hr over 60 Minutes Intravenous Every 24 hours 01/10/15 0407     01/10/15 1200  cefTRIAXone (ROCEPHIN) 2 g in dextrose 5 % 50 mL IVPB - Premix     2 g 100 mL/hr over 30  Minutes Intravenous Every 24 hours 01/10/15 0407     01/10/15 0400  azithromycin (ZITHROMAX) 500 mg in dextrose 5 % 250 mL IVPB  Status:  Discontinued     500 mg 250 mL/hr over 60 Minutes Intravenous Every 24 hours 01/10/15 0359 01/10/15 0406   01/10/15 0230  cefTRIAXone (ROCEPHIN) 1 g in dextrose 5 % 50 mL IVPB     1 g 100 mL/hr over 30 Minutes Intravenous  Once 01/10/15 0216 01/10/15 0334   01/10/15 0230  azithromycin (ZITHROMAX) 500 mg in dextrose 5 % 250 mL IVPB     500 mg 250 mL/hr over 60 Minutes Intravenous  Once 01/10/15 0216 01/10/15 0432     Assessment: Patient with PNA in ED.  Ceftriaxone 1gm iv x1 given in ED.  Patient also on azithromycin for CAP.  Goal of Therapy:  Rocephin based on manufacturer dosing recommendations.   Plan: Ceftriaxone 2gm iv q24hr Follow up culture results  Aleene DavidsonGrimsley Jr, Prachi Oftedahl Crowford 01/10/2015,5:48 AM

## 2015-01-10 NOTE — Progress Notes (Addendum)
ANTIBIOTIC CONSULT NOTE - FOLLOW UP  Pharmacy Consult for Ampicillin/Sulbactam Indication: Aspiration Pneumonia  Allergies  Allergen Reactions  . Seroquel [Quetiapine] Other (See Comments)    "Presented like a stroke"    Patient Measurements: Height: 5\' 3"  (160 cm) Weight: 128 lb 1.4 oz (58.1 kg) IBW/kg (Calculated) : 52.4  Vital Signs: Temp: 98.2 F (36.8 C) (07/13 0426) Temp Source: Oral (07/13 0426) BP: 134/64 mmHg (07/13 0426) Pulse Rate: 79 (07/13 0426) Intake/Output from previous day: 07/12 0701 - 07/13 0700 In: 385 [I.V.:385] Out: 300 [Urine:300] Intake/Output from this shift: Total I/O In: 240 [P.O.:240] Out: 500 [Urine:500]  Labs:  Recent Labs  01/10/15 0011 01/10/15 0426  WBC 9.9 10.0  HGB 11.6* 11.7*  PLT 281 279  CREATININE 0.81 0.71   Estimated Creatinine Clearance: 37.1 mL/min (by C-G formula based on Cr of 0.71).    Assessment: [age] yoF admitted 7/13 with RUQ pain and abdominal CT that showed right consolidation and effusion in the right lung base suggestive of pneumonia.  Azithromycin started per MD dosing for CAP.  Pharmacy was initially consulted to dose ceftriaxone, but changed to ampicillin/sulbactam for aspiration pneumonia coverage.  7/13 >> Azithromycin >>  7/13 >> Ceftriaxone >> 7/13 7/13 >> Ampicillin/Sulbactam >>  Today, 01/10/2015:  Afebrile  WBC WNL  SCr 0.71 with CrCl ~ 37 ml/min CG  Blood and urine cultures pending.   Goal of Therapy:  Appropriate abx dosing, eradication of infection.   Plan:  Ampicillin/Sulbactam 3g IV q6h Continue Azithromycin 500mg  IV q24h Follow up renal fxn, culture results, and clinical course.  Lynann Beaverhristine Vidit Boissonneault PharmD, BCPS Pager [phone] 01/10/2015 11:12 AM

## 2015-01-10 NOTE — ED Notes (Signed)
attempted to call the floor with no answer

## 2015-01-10 NOTE — Progress Notes (Signed)
[facility] PROGRESS NOTE  Janet Nunez JXB:[id number] DOB: 1921-01-09 DOA: 01/09/2015 PCP: Ginette Otto, MD  Brief Summary  Janet Nunez is a 81 y.o. female with history of dementia was brought to the ER after patient started complaining of sudden onset of right upper quadrant pain that started the night prior to presentation. In the ER patient had CT abdomen and pelvis which showed right-sided consolidation and effusion and abdominal attic aneurysm measuring 4.6 cm.  She also had an elevated troponin, however, the daughter requested no aggressive measures such as cardiac catheterization.  She was started on antibiotics for pneumonia.  Upon further questioning, Janet Nunez has had problems with choking and spluttering with meals recently.  Assessment/Plan   Probable aspiration pneumonia secondary to progressive dementia -   Change ceftriaxone to Unasyn and stop azithromycin -   Speech therapy consultation -   Change to dysphagia 2 diet pending speech therapy assessment  Dementia with 5 pound weight loss over the last month and a half, new onset difficulty eating and swallowing.  At baseline, does not recognize place, time.  Sometimes does not recognize her daughter -   Appreciate palliative care assistance -   PT/OT assessments -   DO NOT RESUSCITATE already completed -   Most form to be filled out prior to discharge -   Sitter at bedside for now  For reorientation and to prevent falls -  Start trazodone at bedtime  Abdominal aortic aneurysm 4.6 cm. I discussed the implications of this with the daughter who understands that this could remain stable or a could enlarge. If it ruptures, her mother would have instantaneous and probably painless death.  Elevated troponin, likely secondary to pneumonia and aspiration -   No further workup indicated  Diet:  Dysphagia 2 Access:  PIV IVF:  yes Proph: lovenox  Code Status: DNR Family Communication: patient and her daughter Disposition Plan:  pending speech evaluation, PT/OT, possible placement   Consultants:  Palliative care  Procedures: CT abd/pelvis Infrarenal abdominal aortic aneurysm measuring 4.6 cm maximal AP dimension. No evidence of rupture. Small right pleural effusion with consolidation in the right lung base likely indicates pneumonia, Fibrosis in the lung bases.  Antibiotics:  Ceftriaxone 7/12 > 7/13  unasyn 7/13 >  Azithromycin 7/12 >7/13  HPI/Subjective:  Denies pain, cough, SOB.  Has some chronic rhinorrhea  Which she attributes to allergies   Objective: Filed Vitals:   01/10/15 0115 01/10/15 0130 01/10/15 0426 01/10/15 1341  BP: 184/85 175/78 134/64 136/58  Pulse: 88 81 79 66  Temp:   98.2 F (36.8 C) 97.7 F (36.5 C)  TempSrc:   Oral Oral  Resp: Height:    (1.6 m)   Weight:   58.1 kg (128 lb 1.4 oz)   SpO2: 98% 99% 96% 100%    Intake/Output Summary (Last 24 hours) at 01/10/15 1812 Last data filed at 01/10/15 1730  Gross per 24 hour  Intake   1440 ml  Output    800 ml  Net    640 ml   Filed Weights   01/10/15 0426  Weight: 58.1 kg (128 lb 1.4 oz)   Body mass index is 22.7 kg/(m^2).  Exam:   General:  Pleasant adult female, No acute distress  HEENT:  NCAT, MMM  Cardiovascular:  RRR, nl S1, S2 no mrg, 2+ pulses, warm extremities  Respiratory:  diminshed at the right base and rales heard anterior right check, no wheezes or rhonchi, no increased  WOB  Abdomen:   NABS, soft, NT/ND  MSK:   Normal tone and bulk, no LEE  Neuro:  Grossly moves all extremities  Data Reviewed: Basic Metabolic Panel:  Recent Labs Lab 01/10/15 0011 01/10/15 0426  NA 139  --   K 3.5  --   CL 101  --   CO2 29  --   GLUCOSE 118*  --   BUN 14  --   CREATININE 0.81 0.71  CALCIUM 8.6*  --    Liver Function Tests:  Recent Labs Lab 01/10/15 0011  AST 16  ALT 9*  ALKPHOS 109  BILITOT 0.1*  PROT 6.5  ALBUMIN 3.1*    Recent Labs Lab 01/10/15 0011  LIPASE 30    No results for input(s): AMMONIA in the last 168 hours. CBC:  Recent Labs Lab 01/10/15 0011 01/10/15 0426  WBC 9.9 10.0  NEUTROABS 5.9  --   HGB 11.6* 11.7*  HCT 38.1 37.7  MCV 87.6 87.1  PLT 281 279    No results found for this or any previous visit (from the past 240 hour(s)).   Studies: Dg Chest 2 View  01/10/2015   CLINICAL DATA:  Elevated troponin.  Upper abdominal pain.  EXAM: CHEST  2 VIEW  COMPARISON:  12/13/2014  FINDINGS: The low lung volumes. Mild cardiac enlargement. No significant vascular congestion. Coarse infiltrates in both lungs consistent with fibrosis. No focal airspace disease or consolidation. No blunting of costophrenic angles. No pneumothorax. Degenerative changes in the spine. Calcification of the aorta.  IMPRESSION: Low lung volumes with diffuse fibrosis in the lungs. No evidence of active pulmonary disease.   Electronically Signed   By: Burman Nieves M.D.   On: 01/10/2015 02:20   Ct Abdomen Pelvis W Contrast  01/10/2015   CLINICAL DATA:  Abdominal pain and tenderness since 20/2 30 hours, worse in the right upper quadrant.  EXAM: CT ABDOMEN AND PELVIS WITH CONTRAST  TECHNIQUE: Multidetector CT imaging of the abdomen and pelvis was performed using the standard protocol following bolus administration of intravenous contrast.  CONTRAST:  OMNIPAQUE IOHEXOL 300 MG/ML  SOLN  COMPARISON:  None.  FINDINGS: Consolidation in the right lung base with small right pleural effusion suggesting pneumonia. Underlying fibrosis in both lung bases. Coronary artery calcifications.  The liver, spleen, gallbladder, pancreas, adrenal glands, inferior vena cava, and retroperitoneal lymph nodes are unremarkable. Cyst in the upper pole right kidney. No hydronephrosis in either kidney. Infrarenal abdominal aortic aneurysm measuring 4.6 cm in the AP direction and 4.3 cm transversely. There is circumferential mural calcification and mural thrombus. No evidence of retroperitoneal  hematoma. Iliac arteries are normal in caliber. Stomach is decompressed. Stomach and small bowel are decompressed. Colon is not abnormally distended. Contrast material flows through to the rectum without evidence of small or large bowel obstruction. No free air or free fluid in the abdomen.  Pelvis: Diverticulosis of the sigmoid colon without evidence of diverticulitis. Bladder wall is not thickened. Uterus is surgically absent. Appendix is normal. No free or loculated pelvic fluid collections. No pelvic mass or lymphadenopathy. Compression deformity with previous kyphoplasty at L4. Degenerative changes in the lumbar spine. No destructive bone lesions.  IMPRESSION: Infrarenal abdominal aortic aneurysm measuring 4.6 cm maximal AP dimension. No evidence of rupture. Small right pleural effusion with consolidation in the right lung base likely indicates pneumonia. Fibrosis in the lung bases.   Electronically Signed   By: Burman Nieves M.D.   On: 01/10/2015 02:02  Scheduled Meds: . ampicillin-sulbactam (UNASYN) IV  3 g Intravenous Q6H  . aspirin EC  81 mg Oral Daily  . azithromycin  500 mg Intravenous Q24H  . citalopram  10 mg Oral Daily  . enoxaparin (LOVENOX) injection  40 mg Subcutaneous Q24H  . feeding supplement (ENSURE ENLIVE)  237 mL Oral BID BM  . gabapentin  100 mg Oral BID   Continuous Infusions: . sodium chloride 50 mL/hr at 01/10/15 0400    Active Problems:   Dementia   CAP (community acquired pneumonia)   Elevated troponin   Pneumonia   Aneurysm of infrarenal abdominal aorta   Encounter for palliative care    Time spent: 30 min    Laderrick Wilk, [facility]  [facility] Pager [phone]. If 7PM-7AM, please contact night-coverage at [email or web address], password [email or web address] 01/10/2015, 6:12 PM  LOS: 0 days

## 2015-01-10 NOTE — H&P (Signed)
[facility] History and Physical  Janet Nunez ZOX:[id number] DOB: 08/14/1920 DOA: 01/09/2015  Referring physician: Dr. Elesa MassedWard. PCP: Ginette OttoSTONEKING,HAL THOMAS, MD  Specialists: None.  Chief Complaint: Right upper quadrant pain.  HPI: Janet Nunez is a 81 y.o. female with history of dementia was brought to the ER after patient started complaining of sudden onset of right upper quadrant pain last night around 10 PM. In the ER patient had CT abdomen and pelvis which showed right-sided consolidation and effusion and abdominal attic aneurysm measuring 4.6 cm. In addition patient also found to have elevated troponin. On-call cardiologist was consulted. But at this time patient's daughter has requested no aggressive measure but located treated for pneumonia. On my exam patient is presently chest pain-free and not in distress. Patient's daughter states that patient did not have any nausea vomiting diarrhea fever chills but has been having some productive cough over the last few days.   Review of Systems: As presented in the history of presenting illness, rest negative.  Past Medical History  Diagnosis Date  . Alzheimer disease   . TIA (transient ischemic attack)   . Cataract    Past Surgical History  Procedure Laterality Date  . Abdominal hysterectomy    . Eye surgery     Social History:  reports that she has quit smoking. She does not have any smokeless tobacco history on file. She reports that she does not drink alcohol. Her drug history is not on file. Where does patient live home. Can patient participate in ADLs? No.  Allergies  Allergen Reactions  . Seroquel [Quetiapine] Other (See Comments)    "Presented like a stroke"    Family History:  Family History  Problem Relation Age of Onset  . Dementia Neg Hx       Prior to Admission medications   Medication Sig Start Date End Date Taking? Authorizing Provider  aspirin EC 81 MG tablet Take 81 mg by mouth daily.   Yes Historical Provider,  MD  citalopram (CELEXA) 10 MG tablet Take 10 mg by mouth daily.   Yes Historical Provider, MD  gabapentin (NEURONTIN) 100 MG capsule Take 100 mg by mouth 2 (two) times daily.   Yes Historical Provider, MD    Physical Exam: Filed Vitals:   01/09/15 2332 01/10/15 0115 01/10/15 0130  BP: 173/64 184/85 175/78  Pulse: 90 88 81  Temp: 98.9 F (37.2 C)    TempSrc: Oral    Resp: 18 21 22   SpO2: 93% 98% 99%     General:  Moderately built and poorly nourished.  Eyes: Anicteric no pallor.  ENT: No discharge from the ears eyes nose and mouth. Left eye looks mildly congested.  Neck: No mass felt. No neck rigidity.  Cardiovascular: S1 and S2 heard.  Respiratory: No rhonchi or crepitations.  Abdomen: Soft nontender bowel sounds present.  Skin: No rash.  Musculoskeletal: No edema.  Psychiatric: Patient has dementia.  Neurologic: Alert and oriented to person. Moves all extremities.  Labs on Admission:  Basic Metabolic Panel:  Recent Labs Lab 01/10/15 0011  NA 139  K 3.5  CL 101  CO2 29  GLUCOSE 118*  BUN 14  CREATININE 0.81  CALCIUM 8.6*   Liver Function Tests:  Recent Labs Lab 01/10/15 0011  AST 16  ALT 9*  ALKPHOS 109  BILITOT 0.1*  PROT 6.5  ALBUMIN 3.1*    Recent Labs Lab 01/10/15 0011  LIPASE 30   No results for input(s): AMMONIA in the last 168 hours. CBC:  Recent Labs Lab 01/10/15 0011  WBC 9.9  NEUTROABS 5.9  HGB 11.6*  HCT 38.1  MCV 87.6  PLT 281   Cardiac Enzymes: No results for input(s): CKTOTAL, CKMB, CKMBINDEX, TROPONINI in the last 168 hours.  BNP (last 3 results) No results for input(s): BNP in the last 8760 hours.  ProBNP (last 3 results) No results for input(s): PROBNP in the last 8760 hours.  CBG: No results for input(s): GLUCAP in the last 168 hours.  Radiological Exams on Admission: Dg Chest 2 View  01/10/2015   CLINICAL DATA:  Elevated troponin.  Upper abdominal pain.  EXAM: CHEST  2 VIEW  COMPARISON:  12/13/2014   FINDINGS: The low lung volumes. Mild cardiac enlargement. No significant vascular congestion. Coarse infiltrates in both lungs consistent with fibrosis. No focal airspace disease or consolidation. No blunting of costophrenic angles. No pneumothorax. Degenerative changes in the spine. Calcification of the aorta.  IMPRESSION: Low lung volumes with diffuse fibrosis in the lungs. No evidence of active pulmonary disease.   Electronically Signed   By: Burman Nieves M.D.   On: 01/10/2015 02:20   Ct Abdomen Pelvis W Contrast  01/10/2015   CLINICAL DATA:  Abdominal pain and tenderness since 20/2 30 hours, worse in the right upper quadrant.  EXAM: CT ABDOMEN AND PELVIS WITH CONTRAST  TECHNIQUE: Multidetector CT imaging of the abdomen and pelvis was performed using the standard protocol following bolus administration of intravenous contrast.  CONTRAST:  OMNIPAQUE IOHEXOL 300 MG/ML  SOLN  COMPARISON:  None.  FINDINGS: Consolidation in the right lung base with small right pleural effusion suggesting pneumonia. Underlying fibrosis in both lung bases. Coronary artery calcifications.  The liver, spleen, gallbladder, pancreas, adrenal glands, inferior vena cava, and retroperitoneal lymph nodes are unremarkable. Cyst in the upper pole right kidney. No hydronephrosis in either kidney. Infrarenal abdominal aortic aneurysm measuring 4.6 cm in the AP direction and 4.3 cm transversely. There is circumferential mural calcification and mural thrombus. No evidence of retroperitoneal hematoma. Iliac arteries are normal in caliber. Stomach is decompressed. Stomach and small bowel are decompressed. Colon is not abnormally distended. Contrast material flows through to the rectum without evidence of small or large bowel obstruction. No free air or free fluid in the abdomen.  Pelvis: Diverticulosis of the sigmoid colon without evidence of diverticulitis. Bladder wall is not thickened. Uterus is surgically absent. Appendix is normal. No  free or loculated pelvic fluid collections. No pelvic mass or lymphadenopathy. Compression deformity with previous kyphoplasty at L4. Degenerative changes in the lumbar spine. No destructive bone lesions.  IMPRESSION: Infrarenal abdominal aortic aneurysm measuring 4.6 cm maximal AP dimension. No evidence of rupture. Small right pleural effusion with consolidation in the right lung base likely indicates pneumonia. Fibrosis in the lung bases.   Electronically Signed   By: Burman Nieves M.D.   On: 01/10/2015 02:02    EKG: Independently reviewed. Normal sinus rhythm with nonspecific ST changes.  Assessment/Plan Active Problems:   Dementia   CAP (community acquired pneumonia)   Elevated troponin   Pneumonia   1. Community-acquired pneumonia - patient has been placed on empiric antibiotics ceftriaxone and Zithromax. 2. Elevated troponin - patient's daughter has requested no further blood tests or aggressive measures. Patient is already on aspirin. Presently chest pain-free. 3. Abdominal aortic aneurysm measuring 4.6 cm - patient's daughter has requested no further aggressive measures of workup. 4. Dementia - continue present medications.  Note that patient's daughter has requested no further labs or aggressive  measures. Patient's daughter wants patient to be treated for pneumonia.   DVT Prophylaxis Lovenox.  Code Status: DO NOT RESUSCITATE.  Family Communication: Discussed with daughter.  Disposition Plan: Admit to inpatient.    Gabrial Domine N. [facility] Pager [phone].  If 7PM-7AM, please contact night-coverage [email or web address] Password [facility] 01/10/2015, 3:23 AM

## 2015-01-10 NOTE — Consult Note (Signed)
Consultation Note Date: 01/10/2015   Patient Name: Janet Nunez  DOB: 12/22/1920  MRN: [id number]  Age / Sex: 81 y.o., female   PCP: Merlene LaughterHal Stoneking, MD Referring Physician: Renae FickleMackenzie Short, MD  Reason for Consultation: Establishing goals of care  Palliative Care Assessment and Plan Summary of Established Goals of Care and Medical Treatment Preferences   Janet Elknn Karczewski is a 81 y.o. female with history of dementia, lives at home with daughter, was brought to the ER after patient started complaining of sudden onset of right upper quadrant pain last night around 10 PM. In the ER patient had CT abdomen and pelvis which showed right-sided consolidation and effusion and abdominal attic aneurysm measuring 4.6 cm. In addition patient also found to have elevated troponin.   Based on discussions with patient's daughter at the time of admission, it was decided that no aggressive measures were to be employed but that the patient ought to be treated for pneumonia.   Patient [place] originally from [place], [place] She is widowed, she has 2 daughters. She lived independently until 2010, she lived with her other daughter in [place] from 2010 up until 2-3 months ago. The patient moved in with Verlon AuLeslie (her other daughter who lives in [place], [place] 3 months ago). The patient has home health care, from 7A to 7P, 5 days a week. Verlon AuLeslie works full time. 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 The patient has had gradual progressive decline, she has had choking spells with her soup at home, she is still able to walk with assistance with walker. Verlon AuLeslie has caregiver burnout, Verlon AuLeslie states that [facility] and [facility] among other facilities for the patient.   Discussed role of palliative medicine. Offered supportive care and active listening. Discussed prognosis and disease trajectory with dementia. Discussed some aspects pertaining to the MOST form. All questions answered.  The patient's daughter states that the patient had drawn up her living will decades ago and had made her wishes known that she would not want aggressive measures at end of life.   Continue current mode of care, D/C when deemed medically stable from PNA stand point, case management to follow up with daughter Verlon AuLeslie about whether this is the time to explore SNF rehab attempt or whether the patient ought to go back home with daughter. DNR DNI re confirmed. Goals are palliative in nature and for gentle treatments that can keep the patient comfortable.   Contacts/Participants in Discussion: Primary Decision Maker:  Daughter Verlon AuLeslie at [phone]   HCPOA: yes     Code Status/Advance Care Planning:  DNR DNI  Symptom Management:   No acute symptoms currently, would advise safety sitter PRN for supportive care, company and re direction. Would refrain from restraints.   Palliative Prophylaxis: yes  Additional Recommendations (Limitations, Scope, Preferences):  Observe trajectory  MOST form preliminarily discussed with daughter, discussed options such as comfort care only, do not transfer to hospital, no feeding tube etc.   Discussed outpatient follow up with PCP, watch for need for hospice services in the coming weeks-months based on patient's decline.  Psycho-social/Spiritual:   Support System: yes, daughter Verlon AuLeslie who the patient lives with.   Desire for further Chaplaincy support:no  Prognosis: > 6 - 12 months  Discharge Planning:  likely home with daughter, although daughter requests information from social worker about placement options.     Values: palliative goals with focus on gentle treatments and supportive  care.  Life limiting illness: dementia with sub acute decline.       Chief Complaint/History of Present Illness: abdominal pain, cough.   Primary Diagnoses  Present on Admission:  . CAP (community acquired pneumonia) . Dementia . Elevated troponin .  Pneumonia  Palliative Review of Systems: noted I have reviewed the medical record, interviewed the patient and family, and examined the patient. The following aspects are pertinent.  Past Medical History  Diagnosis Date  . Alzheimer disease   . TIA (transient ischemic attack)   . Cataract    History   Social History  . Marital Status: Widowed    Spouse Name: N/A  . Number of Children: N/A  . Years of Education: N/A   Social History Main Topics  . Smoking status: Former Games developer  . Smokeless tobacco: Not on file  . Alcohol Use: No  . Drug Use: Not on file  . Sexual Activity: Not on file   Other Topics Concern  . None   Social History Narrative   Family History  Problem Relation Age of Onset  . Dementia Neg Hx    Scheduled Meds: . ampicillin-sulbactam (UNASYN) IV  3 g Intravenous Q6H  . aspirin EC  81 mg Oral Daily  . azithromycin  500 mg Intravenous Q24H  . citalopram  10 mg Oral Daily  . enoxaparin (LOVENOX) injection  40 mg Subcutaneous Q24H  . feeding supplement (ENSURE ENLIVE)  237 mL Oral BID BM  . gabapentin  100 mg Oral BID   Continuous Infusions: . sodium chloride 50 mL/hr at 01/10/15 0400   PRN Meds:.acetaminophen **OR** acetaminophen, ondansetron **OR** ondansetron (ZOFRAN) IV Medications Prior to Admission:  Prior to Admission medications   Medication Sig Start Date End Date Taking? Authorizing Provider  aspirin EC 81 MG tablet Take 81 mg by mouth daily.   Yes Historical Provider, MD  citalopram (CELEXA) 10 MG tablet Take 10 mg by mouth daily.   Yes Historical Provider, MD  gabapentin (NEURONTIN) 100 MG capsule Take 100 mg by mouth 2 (two) times daily.   Yes Historical Provider, MD   Allergies  Allergen Reactions  . Seroquel [Quetiapine] Other (See Comments)    "Presented like a stroke"   CBC:    Component Value Date/Time   WBC 10.0 01/10/2015 0426   HGB 11.7* 01/10/2015 0426   HCT 37.7 01/10/2015 0426   PLT 279 01/10/2015 0426   MCV 87.1  01/10/2015 0426   NEUTROABS 5.9 01/10/2015 0011   LYMPHSABS 2.5 01/10/2015 0011   MONOABS 1.2* 01/10/2015 0011   EOSABS 0.4 01/10/2015 0011   BASOSABS 0.0 01/10/2015 0011   Comprehensive Metabolic Panel:    Component Value Date/Time   NA 139 01/10/2015 0011   K 3.5 01/10/2015 0011   CL 101 01/10/2015 0011   CO2 29 01/10/2015 0011   BUN 14 01/10/2015 0011   CREATININE 0.71 01/10/2015 0426   GLUCOSE 118* 01/10/2015 0011   CALCIUM 8.6* 01/10/2015 0011   AST 16 01/10/2015 0011   ALT 9* 01/10/2015 0011   ALKPHOS 109 01/10/2015 0011   BILITOT 0.1* 01/10/2015 0011   PROT 6.5 01/10/2015 0011   ALBUMIN 3.1* 01/10/2015 0011    Physical Exam: Vital Signs: BP 134/64 mmHg  Pulse 79  Temp(Src) 98.2 F (36.8 C) (Oral)  Resp 20  Ht 5\' 3"  (1.6 m)  Wt 58.1 kg (128 lb 1.4 oz)  BMI 22.70 kg/m2  SpO2 96% SpO2: SpO2: 96 % O2 Device: O2 Device: Not Delivered O2  Flow Rate:   Intake/output summary:  Intake/Output Summary (Last 24 hours) at 01/10/15 1422 Last data filed at 01/10/15 0800  Gross per 24 hour  Intake    625 ml  Output    800 ml  Net   -175 ml   LBM: Last BM Date: 01/09/15 Baseline Weight: Weight: 58.1 kg (128 lb 1.4 oz) Most recent weight: Weight: 58.1 kg (128 lb 1.4 oz)  Exam Findings:  Weak appearing elderly lady resting in chair Confused Rhonchi in bases S1S2 Abdomen soft No edema Non focal. Chronic dementia.                        Palliative Performance Scale: 30% Additional Data Reviewed: Recent Labs     01/10/15  0011  01/10/15  0426  WBC  9.9  10.0  HGB  11.6*  11.7*  PLT  281  279  NA  139   --   BUN  14   --   CREATININE  0.81  0.71     Time In: 1300 Time Out: 1355 Time Total: 55 min Greater than 50%  of this time was spent counseling and coordinating care related to the above assessment and plan.  Signed by: Rosalin Hawking, MD [phone] Rosalin Hawking, MD  01/10/2015, 2:22 PM  Please contact Palliative Medicine Team phone at [phone] for  questions and concerns.

## 2015-01-10 NOTE — Progress Notes (Signed)
Attempted to call the ED for report, was unable to get in touch with the nurse. Will call back.

## 2015-01-11 DIAGNOSIS — J690 Pneumonitis due to inhalation of food and vomit: Principal | ICD-10-CM

## 2015-01-11 LAB — CBC
HEMATOCRIT: 38.6 % (ref 36.0–46.0)
Hemoglobin: 11.8 g/dL — ABNORMAL LOW (ref 12.0–15.0)
MCH: 26.8 pg (ref 26.0–34.0)
MCHC: 30.6 g/dL (ref 30.0–36.0)
MCV: 87.7 fL (ref 78.0–100.0)
Platelets: 258 10*3/uL (ref 150–400)
RBC: 4.4 MIL/uL (ref 3.87–5.11)
RDW: 14.7 % (ref 11.5–15.5)
WBC: 8.9 10*3/uL (ref 4.0–10.5)

## 2015-01-11 LAB — BASIC METABOLIC PANEL
Anion gap: 7 (ref 5–15)
BUN: 11 mg/dL (ref 6–20)
CO2: 29 mmol/L (ref 22–32)
Calcium: 8.6 mg/dL — ABNORMAL LOW (ref 8.9–10.3)
Chloride: 105 mmol/L (ref 101–111)
Creatinine, Ser: 0.72 mg/dL (ref 0.44–1.00)
GFR calc non Af Amer: >60 mL/min (ref >60)
GLUCOSE: 109 mg/dL — AB (ref 65–99)
Potassium: 4 mmol/L (ref 3.5–5.1)
Sodium: 141 mmol/L (ref 135–145)

## 2015-01-11 MED ORDER — AMOXICILLIN-POT CLAVULANATE 500-125 MG PO TABS: 1.0000 | ORAL_TABLET | Freq: Two times a day (BID) | ORAL | Status: AC

## 2015-01-11 MED ORDER — TRAZODONE HCL 50 MG PO TABS: 50.0000 mg | ORAL_TABLET | Freq: Every day | ORAL | Status: AC

## 2015-01-11 MED ORDER — ENSURE ENLIVE PO LIQD: 237.0000 mL | Freq: Two times a day (BID) | ORAL | Status: AC

## 2015-01-11 NOTE — Clinical Social Work Placement (Signed)
   CLINICAL SOCIAL WORK PLACEMENT  NOTE  Date:  01/11/2015  Patient Details  Name: Concepcion Elknn Savino MRN: [id number] Date of Birth: 02/16/1921  Clinical Social Work is seeking post-discharge placement for this patient at the Assisted Living Facility level of care (*CSW will initial, date and re-position this form in  chart as items are completed):  Yes   Patient/family provided with [place] Clinical Social Work Department's list of facilities offering this level of care within the geographic area requested by the patient (or if unable, by the patient's family).  Yes   Patient/family informed of their freedom to choose among providers that offer the needed level of care, that participate in Medicare, Medicaid or managed care program needed by the patient, have an available bed and are willing to accept the patient.  Yes   Patient/family informed of [place]'s ownership interest in [facility] and [facility], as well as of the fact that they are under no obligation to receive care at these facilities.  PASRR submitted to EDS on       PASRR number received on       Existing PASRR number confirmed on       FL2 transmitted to all facilities in geographic area requested by pt/family on 01/11/15     FL2 transmitted to all facilities within larger geographic area on       Patient informed that his/her managed care company has contracts with or will negotiate with certain facilities, including the following:            Patient/family informed of bed offers received.  Patient chooses bed at       Physician recommends and patient chooses bed at      Patient to be transferred to   on  .  Patient to be transferred to facility by       Patient family notified on   of transfer.  Name of family member notified:        PHYSICIAN       Additional Comment:   Pt plans to d/c home with home health. SNF and ALF search for private pay rates initiated in order to provide the responses  to pt daughter as a resource as pt family plans to explore placement from home.  _______________________________________________ Orson EvaKIDD, Amariah Kierstead A, LCSW 01/11/2015, 5:12 PM

## 2015-01-11 NOTE — Progress Notes (Signed)
Patient to be discharged to care of daughter on 01/12/2015 in AM.

## 2015-01-11 NOTE — Evaluation (Signed)
Physical Therapy Evaluation Patient Details Name: Char Feltman MRN: [id number] DOB: [birth year] Today's Date: 01/11/2015   History of Present Illness  Pt is a 81 year old female admitted for probable aspiration pneumonia secondary to progressive dementia with hx of Alzheimers, TIA. CT abdomen and pelvis which showed right-sided consolidation and effusion and abdominal aortic aneurysm measuring 4.6 cm  Clinical Impression  Pt admitted with above diagnosis. Pt currently with functional limitations due to the deficits listed below (see PT Problem List).  Pt will benefit from skilled PT to increase their independence and safety with mobility to allow discharge to the venue listed below.  Pt assisted with ambulating in hallway using RW.  Mostly requires cues for safety and likely close to mobility baseline.         Follow Up Recommendations No PT follow up;Supervision for mobility/OOB    Equipment Recommendations  None recommended by PT    Recommendations for Other Services       Precautions / Restrictions Precautions Precautions: Fall      Mobility  Bed Mobility Overal bed mobility: Needs Assistance Bed Mobility: Supine to Sit     Supine to sit: Min assist     General bed mobility comments: assist for trunk upright  Transfers Overall transfer level: Needs assistance Equipment used: Rolling walker (2 wheeled) Transfers: Sit to/from Stand Sit to Stand: Min assist         General transfer comment: assist from low surface, cues for safety  Ambulation/Gait Ambulation/Gait assistance: Min guard Ambulation Distance (Feet): 120 Feet Assistive device: Rolling walker (2 wheeled) Gait Pattern/deviations: Step-through pattern;Decreased stride length;Drifts right/left     General Gait Details: slight assist for maneuvering RW  Stairs            Wheelchair Mobility    Modified Rankin (Stroke Patients Only)       Balance                                              Pertinent Vitals/Pain Pain Assessment: No/denies pain    Home Living Family/patient expects to be discharged to:: Private residence Living Arrangements: Children               Additional Comments: pt poor historian, from home with daughter per chart    Prior Function           Comments: appears ambulatory with RW     Hand Dominance        Extremity/Trunk Assessment               Lower Extremity Assessment: Overall WFL for tasks assessed         Communication   Communication: No difficulties  Cognition Arousal/Alertness: Awake/alert Behavior During Therapy: WFL for tasks assessed/performed Overall Cognitive Status: No family/caregiver present to determine baseline cognitive functioning (hx of dementia, only orientated to person)                      General Comments      Exercises        Assessment/Plan    PT Assessment Patient needs continued PT services  PT Diagnosis Difficulty walking   PT Problem List Decreased strength;Decreased activity tolerance;Decreased mobility;Decreased cognition  PT Treatment Interventions DME instruction;Gait training;Functional mobility training;Patient/family education;Therapeutic exercise;Therapeutic activities   PT Goals (Current goals can be found in the Care Plan  section) Acute Rehab PT Goals PT Goal Formulation: Patient unable to participate in goal setting Time For Goal Achievement: 01/18/15 Potential to Achieve Goals: Good    Frequency Min 2X/week   Barriers to discharge        Co-evaluation               End of Session Equipment Utilized During Treatment: Gait belt Activity Tolerance: Patient tolerated treatment well Patient left: in chair;with call bell/phone within reach;with chair alarm set;with nursing/sitter in room           Time: 1145-1200 PT Time Calculation (min) (ACUTE ONLY): 15 min   Charges:   PT Evaluation $Initial PT Evaluation Tier I: 1  Procedure     PT G Codes:        Gerik Coberly,KATHrine E 01/11/2015, 12:37 PM Zenovia JarredKati Katelynne Revak, PT, DPT 01/11/2015 Pager: [phone]

## 2015-01-11 NOTE — Evaluation (Signed)
Clinical/Bedside Swallow Evaluation Patient Details  Name: Janet Nunez MRN: [id number] Date of Birth: [birth year]  Today's Date: 01/11/2015 Time: SLP Start Time (ACUTE ONLY): 1015 SLP Stop Time (ACUTE ONLY): 1046 SLP Time Calculation (min) (ACUTE ONLY): 31 min  Past Medical History:  Past Medical History  Diagnosis Date  . Alzheimer disease   . TIA (transient ischemic attack)   . Cataract    Past Surgical History:  Past Surgical History  Procedure Laterality Date  . Abdominal hysterectomy    . Eye surgery     HPI:  Janet Nunez is a 81 y.o. female with history of dementia, lives at home with daughter, was brought to the ER after patient started complaining of sudden onset of right upper quadrant pain.  In the ER patient had CT abdomen and pelvis which showed right-sided consolidation and effusion and abdominal aortic aneurysm measuring 4.6 cm. In addition patient also found to have elevated troponin.  Pt with PMH + for dementia and has been diagnosed with pna. CXR indicated Low lung volumes with diffuse fibrosis in the lungs.  Per review of pallaitive note, pt with choking episode at home on soup.  Diet is dys2/thin pending SLP swallow evaluation per MD.      Assessment / Plan / Recommendation Clinical Impression  Pt presents with a functional swallow based on clinical evaluation.  CN exam unremarkable and pt able to self feed  - maximizing airway protection. RN indicated pt family reported to prior nurse that pt was having pain with swallowing.  No indications of discomfort noted.  Observed pt consuming water, pills with water given by RN, chocolate pudding and graham cracker.  Subtle throat clearing noted with chocolate pudding, pt states "Chocolate makes me do that" - suspect residuals.  No s/s of aspiration with all other consistencies, timely swallow with clear voice throughout.    Given pt with dentures and current pna, rec mechanical soft diet with intermittent supervision.  Avoiding mixed  consistencies may be beneficial given recent choking episode on soup - likely due to oral discoordination.  Unless MD is worried about silent nature of dysphagia and desire MBS,SLP to sign off.  Thanks for the order.  Swallow precautions posted in room and pt educated.     Aspiration Risk  Mild    Diet Recommendation Dysphagia 3 (Mech soft);Thin   Medication Administration: Whole meds with liquid Compensations: Slow rate;Small sips/bites    Other  Recommendations Oral Care Recommendations: Oral care BID   Follow Up Recommendations    n/a   Frequency and Duration   n/a      Pertinent Vitals/Pain Afebrile, decreased    SLP Swallow Goals     Swallow Study Prior Functional Status    see hhx   General Date of Onset: 01/11/15 Other Pertinent Information: Janet Nunez is a 81 y.o. female with history of dementia, lives at home with daughter, was brought to the ER after patient started complaining of sudden onset of right upper quadrant pain.  In the ER patient had CT abdomen and pelvis which showed right-sided consolidation and effusion and abdominal aortic aneurysm measuring 4.6 cm. In addition patient also found to have elevated troponin.  Pt with PMH + for dementia and has been diagnosed with pna. CXR indicated Low lung volumes with diffuse fibrosis in the lungs.  Per review of pallaitive note, pt with choking episode at home on soup.  Diet is dys2/thin pending SLP swallow evaluation per MD.    Type of Study:  Bedside swallow evaluation Diet Prior to this Study: Dysphagia 2 (chopped);Thin liquids Temperature Spikes Noted: No Respiratory Status: Room air History of Recent Intubation: No Behavior/Cognition: Alert;Cooperative;Pleasant mood;Other (Comment) (pt has dementia) Oral Cavity - Dentition: Edentulous (dentures) Self-Feeding Abilities: Able to feed self Patient Positioning: Upright in bed Baseline Vocal Quality: Normal Volitional Cough: Strong Volitional Swallow: Able to elicit     Oral/Motor/Sensory Function Overall Oral Motor/Sensory Function: Appears within functional limits for tasks assessed   Ice Chips Ice chips: Not tested   Thin Liquid Thin Liquid: Within functional limits Presentation: Self Fed;Straw    Nectar Thick Nectar Thick Liquid: Not tested   Honey Thick Honey Thick Liquid: Not tested   Puree Puree: Impaired Presentation: Self Fed;Spoon Pharyngeal Phase Impairments: Throat Clearing - Delayed Other Comments: mild amount of throat clearing, pt reports "chocolate makes me do that"   Solid   GO    Solid: Within functional limits Presentation: Self Janet Nunez      Janet Eble, MS [facility] SLP [phone]

## 2015-01-11 NOTE — Care Management Note (Signed)
Case Management Note  Patient Details  Name: Janet Nunez MRN: [id number] Date of Birth: 12/06/1920  Subjective/Objective:                 81 yo admitted with CAP   Action/Plan: From home with daughter Janet Nunez  Expected Discharge Date:                  Expected Discharge Plan:  Home w Home Health Services  In-House Referral:  Clinical Social Work  Discharge planning Services  CM Consult  Post Acute Care Choice:    Choice offered to:     DME Arranged:    DME Agency:     HH Arranged:  RN, PT, Nurse's Aide HH Agency:  [facility]  Status of Service:  In process, will continue to follow  Medicare Important Message Given:    Date Medicare IM Given:    Medicare IM give by:    Date Additional Medicare IM Given:    Additional Medicare Important Message give by:     If discussed at Long Length of Stay Meetings, dates discussed:    Additional Comments: Pt lives with daughter Janet Nunez and receives in home caregivers from 7a-7p 5 days a week. PT is not recommending any PT follow up only supervision for mobility. Janet Nunez told SW that she had [facility] in the past and was now interested in using their services. [facility] rep given referral. MD orders will be needed at DC. CM will continue to follow. Bartholome BillCLEMENTS, Kiley Torrence H, RN 01/11/2015, 3:23 PM

## 2015-01-11 NOTE — Clinical Social Work Note (Signed)
Clinical Social Work Assessment  Patient Details  Name: Janet Nunez MRN: [id number] Date of Birth: 10/15/1920  Date of referral:  01/11/15               Reason for consult:  Discharge Planning                Permission sought to share information with:  Family Supports Permission granted to share information::  No (pt unable to participate in assessment secondar to dementia- assessment completed with pt daughter)  Name::     Ledon SnareLeslie Felan  Agency::     Relationship::  daughter  Contact Information:  [phone]  Housing/Transportation Living arrangements for the past 2 months:  Single Family Home Source of Information:  Adult Children Patient Interpreter Needed:  None Criminal Activity/Legal Involvement Pertinent to Current Situation/Hospitalization:  No - Comment as needed Significant Relationships:  Adult Children Lives with:  Adult Children Do you feel safe going back to the place where you live?  Yes Need for family participation in patient care:  Yes (Comment)  Care giving concerns:  Pt daughter requested to speak to CSW regarding short term rehab options. PT recommending No PT follow up.   Social Worker assessment / plan:  CSW received referral that pt daughter requesting CSW to speak with her regarding short term rehab options.   CSW visited pt room and pt sleeping comfortably in recliner. Per chart, pt oriented to person only and would be unable to engage in conversation. CSW contacted pt daughter via telephone. CSW introduced self and explained role. Pt daughter reports that pt lives with her and pt has 7 am - 7 pm caregivers while pt daughter is at work. CSW inquired with pt daughter regarding questions concerning SNF. Pt daughter states that she has contacted facilities, but has not felt comfortable with the financial obligation of the facility. CSW discussed that PT recommendation is for No follow up PT. Pt daughter stated that pt PCP had recommended home health PT and ordered  home health through [facility], but they had not yet started. Pt daughter states that she plans for pt to return home with home health and continued caregivers, but would like CSW to initiate a search in order for pt daughter to have private pay options for facilities.  CSW completed FL2 and [place] SN[place] and [place] options.   CSW to provide responses and resources to pt daughter tomorrow morning prior to pt d/c.   CSW to continue to follow to provide support.   Employment status:  Retired Health and safety inspectornsurance information:  Medicare PT Recommendations:  No Follow Up Information / Referral to community resources:  Skilled Holiday representativeursing Facility, Other (Comment Required) (Assisted Living Facilities)  Patient/Family's Response to care:  Pt oriented to person only. Pt daughter supportive and actively involved in pt care. Pt daughter plans to continue the current care that pt has at home while pt family continues to explore options from home.   Patient/Family's Understanding of and Emotional Response to Diagnosis, Current Treatment, and Prognosis: Pt daughter stated that she plans to come to hospital this afternoon in order to clarify her questions with MD, but pt daughter displayed good understanding of plan of care.   Emotional Assessment Appearance:  Appears stated age Attitude/Demeanor/Rapport:  Unable to Assess (pt oriented to person only secondary to dementia) Affect (typically observed):  Unable to Assess (pt oriented to person only secondary to dementia) Orientation:  Oriented to Self Alcohol / Substance  use:  Not Applicable Psych involvement (Current and /or in the community):  No (Comment)  Discharge Needs  Concerns to be addressed:  Discharge Planning Concerns Readmission within the last 30 days:  No Current discharge risk:  None Barriers to Discharge:  No Barriers Identified   KIDD, SUZANNA A, LCSW 01/11/2015, 4:59 PM  [phone]

## 2015-01-11 NOTE — Discharge Summary (Signed)
Physician Discharge Summary  Janet Nunez ZOX:[id number] DOB: Feb 26, 1921 DOA: 01/09/2015  PCP: Ginette Otto, MD  Admit date: 01/09/2015 Discharge date:  Pending (anticipate 7/15)  Recommendations for Outpatient Follow-up:  1. F/u with primary care doctor 2. HH PT and SW resumed  Discharge Diagnoses:  Active Problems:   Dementia   CAP (community acquired pneumonia)   Elevated troponin   Pneumonia   Aneurysm of infrarenal abdominal aorta   Encounter for palliative care   Discharge Condition:  Stable, improved  Diet recommendation: Dysphagia 3 with thin liquids   Wt Readings from Last 3 Encounters:  01/10/15 58.1 kg (128 lb 1.4 oz)  12/14/14 58.196 kg (128 lb 4.8 oz)    History of present illness:   Janet Nunez is a 81 y.o. female with history of dementia was brought to the ER after patient started complaining of sudden onset of right upper quadrant pain that started the night prior to presentation. In the ER patient had CT abdomen and pelvis which showed right-sided consolidation and effusion and abdominal attic aneurysm measuring 4.6 cm. She also had an elevated troponin, however, the daughter requested no aggressive measures such as cardiac catheterization. She was started on antibiotics for pneumonia. Upon further questioning, Janet Nunez has had problems with choking and spluttering with meals recently.    Hospital Course:   Probable aspiration pneumonia secondary to progressive dementia.  She was initially started on ceftriaxone and azithromycin for community-acquired pneumonia. Upon further conversations with family members however, it was determined that she has had some difficulty eating and swallowing with occasional choking. Given her history of dementia, this was concerning for aspiration pneumonia. Her ceftriaxone and azithromycin were discontinued and she was started on Unasyn. Speech therapy was consulted and recommended a dysphagia 3 with thin liquids diet.  She was  transitioned to Augmentin to complete a 7 day course of antibiotics for aspiration pneumonia.    Dementia with 5 pound weight loss over the last month and a half, new onset difficulty eating and swallowing. At baseline, does not recognize place, time. Sometimes she does not recognize her daughter. Palliative care was consulted. The patient is DO NOT RESUSCITATE and the most form is filled out prior to discharge. She is DO NOT RESUSCITATE/limited interventions/positive limited antibiotics/positive limited IV fluids/no feeding tube.  She was started on trazodone at bedtime  Abdominal aortic aneurysm 4.6 cm. I discussed the implications of this with the daughter who understands that this could remain stable or a could enlarge. If it ruptures, her mother would have instantaneous and probably painless death.  Elevated troponin, likely secondary to pneumonia and aspiration.  Her elevated troponin was discussed with cardiology, however the patient and her daughter did not have interesting cardiac catheterization and so no further workup was pursued.    Consultants:  Palliative care  Procedures: CT abd/pelvis Infrarenal abdominal aortic aneurysm measuring 4.6 cm maximal AP dimension. No evidence of rupture. Small right pleural effusion with consolidation in the right lung base likely indicates pneumonia, Fibrosis in the lung bases.  Antibiotics:  Ceftriaxone 7/12 > 7/13  unasyn 7/13 >  Azithromycin 7/12 >7/13  Discharge Exam: Filed Vitals:   01/11/15 0800  BP: 132/36  Pulse:   Temp:   Resp:    Filed Vitals:   01/10/15 1341 01/10/15 2251 01/11/15 0659 01/11/15 0800  BP: 136/58 134/83 143/119 132/36  Pulse: 66 95 92   Temp: 97.7 F (36.5 C)  98.3 F (36.8 C)   TempSrc: Oral Oral Oral  Resp: 16 20 18    Height:      Weight:      SpO2: 100% 93% 93%      General: Pleasant adult female, No acute distress, cracking jokes during my visit with her  HEENT: NCAT,  MMM  Cardiovascular: RRR, nl S1, S2 no mrg, 2+ pulses, warm extremities  Respiratory: Diminshed at the right base and rales heard anterior right chest, no wheezes or rhonchi, no increased WOB  Abdomen: NABS, soft, NT/ND  MSK: Normal tone and bulk, no LEE  Neuro: Grossly moves all extremities  Discharge Instructions      Discharge Instructions    Call MD for:  difficulty breathing, headache or visual disturbances    Complete by:  As directed      Call MD for:  extreme fatigue    Complete by:  As directed      Call MD for:  hives    Complete by:  As directed      Call MD for:  persistant dizziness or light-headedness    Complete by:  As directed      Call MD for:  persistant nausea and vomiting    Complete by:  As directed      Call MD for:  severe uncontrolled pain    Complete by:  As directed      Call MD for:  temperature >100.4    Complete by:  As directed      Diet general    Complete by:  As directed      Discharge instructions    Complete by:  As directed   Please continue antibiotics until all the tabs are gone, next dose on Friday evening.  To prevent aspiration, please have her sit up in a chair while eating.  Try to use softer foods (but she does not need mashed or pureed foods) and try to not mix textures.  To help with sleep, I have given a prescription for trazodone.  She may take half a tab or up to 1 full tab at night to help with sleep.     Increase activity slowly    Complete by:  As directed             Medication List    TAKE these medications        amoxicillin-clavulanate 500-125 MG per tablet  Commonly known as:  AUGMENTIN  Take 1 tablet (500 mg total) by mouth 2 (two) times daily.     aspirin EC 81 MG tablet  Take 81 mg by mouth daily.     citalopram 10 MG tablet  Commonly known as:  CELEXA  Take 10 mg by mouth daily.     feeding supplement (ENSURE ENLIVE) Liqd  Take 237 mLs by mouth 2 (two) times daily between meals.      gabapentin 100 MG capsule  Commonly known as:  NEURONTIN  Take 100 mg by mouth 2 (two) times daily.     traZODone 50 MG tablet  Commonly known as:  DESYREL  Take 1 tablet (50 mg total) by mouth at bedtime.       Follow-up Information    Follow up with Ginette Otto, MD. Schedule an appointment as soon as possible for a visit in 2 weeks.   Specialty:  Internal Medicine   Contact information:   [address] [place] [address] [phone]        The results of significant diagnostics from this hospitalization (including  imaging, microbiology, ancillary and laboratory) are listed below for reference.    Significant Diagnostic Studies: Dg Chest 2 View  01/10/2015   CLINICAL DATA:  Elevated troponin.  Upper abdominal pain.  EXAM: CHEST  2 VIEW  COMPARISON:  12/13/2014  FINDINGS: The low lung volumes. Mild cardiac enlargement. No significant vascular congestion. Coarse infiltrates in both lungs consistent with fibrosis. No focal airspace disease or consolidation. No blunting of costophrenic angles. No pneumothorax. Degenerative changes in the spine. Calcification of the aorta.  IMPRESSION: Low lung volumes with diffuse fibrosis in the lungs. No evidence of active pulmonary disease.   Electronically Signed   By: Burman Nieves M.D.   On: 01/10/2015 02:20   Dg Chest 2 View  12/13/2014   CLINICAL DATA:  Altered mental status.  Hypoglycemia.  EXAM: CHEST  2 VIEW  COMPARISON:  None.  FINDINGS: Cardiac enlargement without vascular congestion. Diffuse interstitial pattern to the lungs likely represent fibrosis and chronic bronchitic change. No focal airspace disease or consolidation. No blunting of costophrenic angles. No pneumothorax. Calcified and tortuous aorta. Degenerative changes in the spine and shoulders.  IMPRESSION: Cardiac enlargement. Fibrosis and chronic bronchitic changes in the lungs. No evidence of active disease.   Electronically Signed   By: Burman Nieves M.D.   On: 12/13/2014 23:49   Ct Head Wo Contrast  12/14/2014   CLINICAL DATA:  Acute onset of slurred speech. Head feeling funny. Initial encounter.  EXAM: CT HEAD WITHOUT CONTRAST  TECHNIQUE: Contiguous axial images were obtained from the base of the skull through the vertex without intravenous contrast.  COMPARISON:  None.  FINDINGS: There is no evidence of acute infarction, mass lesion, or intra- or extra-axial hemorrhage on CT.  Prominence of the ventricles and sulci reflects moderately severe cortical volume loss. Diffuse periventricular and subcortical white matter change reflects small vessel ischemic microangiopathy. Cerebellar atrophy is noted. Chronic ischemic change is noted at the external capsule bilaterally, and a few chronic lacunar infarcts are noted at the basal ganglia.  The brainstem and fourth ventricle are within normal limits. The cerebral hemispheres demonstrate grossly normal gray-white differentiation. No mass effect or midline shift is seen.  There is no evidence of fracture; visualized osseous structures are unremarkable in appearance. The orbits are within normal limits. The paranasal sinuses and mastoid air cells are well-aerated. No significant soft tissue abnormalities are seen.  IMPRESSION: 1. No acute intracranial pathology seen on CT. 2. Moderately severe cortical volume loss noted. Diffuse small vessel ischemic microangiopathy seen. 3. Chronic ischemic change at the external capsule bilaterally, and few chronic lacunar infarcts at the basal ganglia.   Electronically Signed   By: Roanna Raider M.D.   On: 12/14/2014 00:04   Mr Brain Wo Contrast  12/14/2014   CLINICAL DATA:  Altered mental status for a few days, tired. Hypoglycemia. History of Alzheimer's.  EXAM: MRI HEAD WITHOUT CONTRAST  TECHNIQUE: Multiplanar, multiecho pulse sequences of the brain and surrounding structures were obtained without intravenous contrast.  COMPARISON:  CT head December 13, 2014  FINDINGS:  No reduced diffusion to suggest acute ischemia. No susceptibility artifact to suggest hemorrhage.  Ventricles and sulci are normal for patient's age. Confluent supratentorial white matter FLAIR T2 hyperintensities, patchy pontine T2 hyperintensities without midline shift, mass effect or mass lesions on this mildly motion degraded examination. Tiny T2 hyperintensities in the basal ganglia and thalamus favor perivascular spaces.  Symmetric mildly prominent posterior fossa extra-axial cerebral spinal fluid spaces suggests underlying atrophy, less likely chronic  subdural hematoma/hygromas. Normal major intracranial vascular flow voids seen at the skull base. Status post bilateral ocular lens implants. Trace maxillary sinus mucosal thickening without air-fluid levels. The mastoid air cells are well aerated. Moderate temporomandibular osteoarthrosis. No abnormal sellar expansion. No cerebellar tonsillar ectopia. No suspicious calvarial bone marrow signal.  IMPRESSION: No acute intracranial process on this mildly motion degraded examination.  Involutional changes. Severe white matter changes compatible chronic small vessel ischemic disease .   Electronically Signed   By: Awilda Metroourtnay  Bloomer M.D.   On: 12/14/2014 03:53   Ct Abdomen Pelvis W Contrast  01/10/2015   CLINICAL DATA:  Abdominal pain and tenderness since 20/2 30 hours, worse in the right upper quadrant.  EXAM: CT ABDOMEN AND PELVIS WITH CONTRAST  TECHNIQUE: Multidetector CT imaging of the abdomen and pelvis was performed using the standard protocol following bolus administration of intravenous contrast.  CONTRAST:  100mL OMNIPAQUE IOHEXOL 300 MG/ML  SOLN  COMPARISON:  None.  FINDINGS: Consolidation in the right lung base with small right pleural effusion suggesting pneumonia. Underlying fibrosis in both lung bases. Coronary artery calcifications.  The liver, spleen, gallbladder, pancreas, adrenal glands, inferior vena cava, and retroperitoneal lymph nodes are  unremarkable. Cyst in the upper pole right kidney. No hydronephrosis in either kidney. Infrarenal abdominal aortic aneurysm measuring 4.6 cm in the AP direction and 4.3 cm transversely. There is circumferential mural calcification and mural thrombus. No evidence of retroperitoneal hematoma. Iliac arteries are normal in caliber. Stomach is decompressed. Stomach and small bowel are decompressed. Colon is not abnormally distended. Contrast material flows through to the rectum without evidence of small or large bowel obstruction. No free air or free fluid in the abdomen.  Pelvis: Diverticulosis of the sigmoid colon without evidence of diverticulitis. Bladder wall is not thickened. Uterus is surgically absent. Appendix is normal. No free or loculated pelvic fluid collections. No pelvic mass or lymphadenopathy. Compression deformity with previous kyphoplasty at L4. Degenerative changes in the lumbar spine. No destructive bone lesions.  IMPRESSION: Infrarenal abdominal aortic aneurysm measuring 4.6 cm maximal AP dimension. No evidence of rupture. Small right pleural effusion with consolidation in the right lung base likely indicates pneumonia. Fibrosis in the lung bases.   Electronically Signed   By: Burman NievesWilliam  Stevens M.D.   On: 01/10/2015 02:02    Microbiology: Recent Results (from the past 240 hour(s))  Blood culture (routine x 2)     Status: None (Preliminary result)   Collection Time: 01/10/15  2:25 AM  Result Value Ref Range Status   Specimen Description BLOOD LEFT HAND  Final   Special Requests BOTTLES DRAWN AEROBIC AND ANAEROBIC 5ML  Final   Culture   Final    NO GROWTH 1 DAY Performed at [facility]    Report Status PENDING  Incomplete  Blood culture (routine x 2)     Status: None (Preliminary result)   Collection Time: 01/10/15  2:30 AM  Result Value Ref Range Status   Specimen Description BLOOD RIGHT HAND  Final   Special Requests BOTTLES DRAWN AEROBIC AND ANAEROBIC 5ML  Final   Culture    Final    NO GROWTH 1 DAY Performed at [facility]    Report Status PENDING  Incomplete     Labs: Basic Metabolic Panel:  Recent Labs Lab 01/10/15 0011 01/10/15 0426 01/11/15 0400  NA 139  --  141  K 3.5  --  4.0  CL 101  --  105  CO2 29  --  29  GLUCOSE 118*  --  109*  BUN 14  --  11  CREATININE 0.81 0.71 0.72  CALCIUM 8.6*  --  8.6*   Liver Function Tests:  Recent Labs Lab 01/10/15 0011  AST 16  ALT 9*  ALKPHOS 109  BILITOT 0.1*  PROT 6.5  ALBUMIN 3.1*    Recent Labs Lab 01/10/15 0011  LIPASE 30   No results for input(s): AMMONIA in the last 168 hours. CBC:  Recent Labs Lab 01/10/15 0011 01/10/15 0426 01/11/15 0400  WBC 9.9 10.0 8.9  NEUTROABS 5.9  --   --   HGB 11.6* 11.7* 11.8*  HCT 38.1 37.7 38.6  MCV 87.6 87.1 87.7  PLT 281 279 258   Cardiac Enzymes: No results for input(s): CKTOTAL, CKMB, CKMBINDEX, TROPONINI in the last 168 hours. BNP: BNP (last 3 results) No results for input(s): BNP in the last 8760 hours.  ProBNP (last 3 results) No results for input(s): PROBNP in the last 8760 hours.  CBG: No results for input(s): GLUCAP in the last 168 hours.  Time coordinating discharge: 35 minutes  Signed:  Torrell Krutz  [facility] 01/11/2015, 4:44 PM

## 2015-01-12 LAB — CBC
HCT: 36.6 % (ref 36.0–46.0)
Hemoglobin: 11.4 g/dL — ABNORMAL LOW (ref 12.0–15.0)
MCH: 26.9 pg (ref 26.0–34.0)
MCHC: 31.1 g/dL (ref 30.0–36.0)
MCV: 86.3 fL (ref 78.0–100.0)
Platelets: 240 10*3/uL (ref 150–400)
RBC: 4.24 MIL/uL (ref 3.87–5.11)
RDW: 14.7 % (ref 11.5–15.5)
WBC: 8.5 10*3/uL (ref 4.0–10.5)

## 2015-01-12 LAB — BASIC METABOLIC PANEL
ANION GAP: 7 (ref 5–15)
BUN: 10 mg/dL (ref 6–20)
CALCIUM: 8.4 mg/dL — AB (ref 8.9–10.3)
CHLORIDE: 105 mmol/L (ref 101–111)
CO2: 29 mmol/L (ref 22–32)
Creatinine, Ser: 0.63 mg/dL (ref 0.44–1.00)
GFR calc Af Amer: >60 mL/min (ref >60)
Glucose, Bld: 108 mg/dL — ABNORMAL HIGH (ref 65–99)
POTASSIUM: 3.8 mmol/L (ref 3.5–5.1)
SODIUM: 141 mmol/L (ref 135–145)

## 2015-01-12 NOTE — Progress Notes (Signed)
This CM [facility] that they are unable to provide care for the pt at this time. Pt has already been DC'd home with daughter Verlon AuLeslie. This CM called Leslie's mobile phone number and left message to alert her that another [facility] would need to be chosen. This CM will await return phone call to set up [facility] provider that is second choice of pt.  Sandford CrazeNora Becker Christopher RN, BSN, [place] [phone]

## 2015-01-12 NOTE — Progress Notes (Addendum)
EDCM called patient's daughter Janet Nunez on home phone and left voice message with phone number for call back regarding home health services.  Awaiting return call.  01/12/2015 A.Ronelle Michie RNCM 1902pm  EDCM has still not received phone call from patient's daughter.  EDCM has not left a voice mail with generic message on patient's daughter's cell phone with phone number for call back.  Awaiting call back.

## 2015-01-12 NOTE — Progress Notes (Signed)
Pt for discharge home with pt daughter today.   CSW visited pt room and pt daughter not present at this time. CSW left resources at bedside for SNF/ALF facilities and private pay rates and Senior Living guide as pt daughter expressed wanting to explore options for placement for pt from home. MD ordered home health social worker to assist with these efforts from home.  CSW contacted pt daughter, Ledon SnareLeslie Bowdoin via telephone and left message to notify pt daughter of information provided at bedside.   No further social work needs identified at this time.  CSW signing off.   Loletta SpecterSuzanna Licia Harl, MSW, LCSW Clinical Social Work [phone]

## 2015-01-12 NOTE — Progress Notes (Signed)
Patient seen and examined and stable for discharge.  No changes to discharge instructions or medications.

## 2015-01-15 LAB — CULTURE, BLOOD (ROUTINE X 2)
Culture: NO GROWTH
Culture: NO GROWTH

## 2015-01-24 ENCOUNTER — Ambulatory Visit
Admission: RE | Admit: 2015-01-24 | Discharge: 2015-01-24 | Disposition: A | Discharge status: Home / Self Care | Payer: Medicare Other | Source: Ambulatory Visit | Attending: Geriatric Medicine | Admitting: Geriatric Medicine

## 2015-01-24 ENCOUNTER — Other Ambulatory Visit: Payer: Self-pay | Admitting: Geriatric Medicine

## 2015-01-24 DIAGNOSIS — J189 Pneumonia, unspecified organism: Secondary | ICD-10-CM

## 2015-03-01 DEATH — deceased

## 2017-05-23 IMAGING — MR MR HEAD W/O CM
8 of 10 series · 33 of 48 positions shown · non-contrast
Comparison: CT head December 13, 2014

CLINICAL DATA: Altered mental status for a few days, tired.
Hypoglycemia. History of Alzheimer's.

EXAM:
MRI HEAD WITHOUT CONTRAST
TECHNIQUE: Multiplanar, multiecho pulse sequences of the brain and surrounding
structures were obtained without intravenous contrast.

[Series 4: DWI · axial · 3.0mm · 0.94mm/px · z∈[-150,-18]mm · 7 of 90 slices shown (1 of 4)]
[im 1/90]
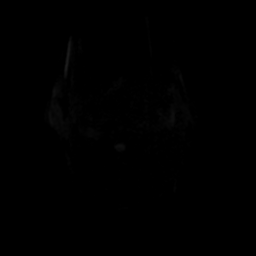
[im 15/90]
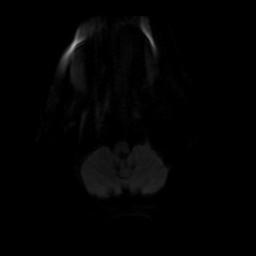
[im 30/90]
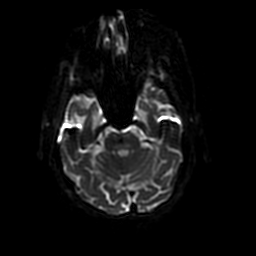
[im 45/90]
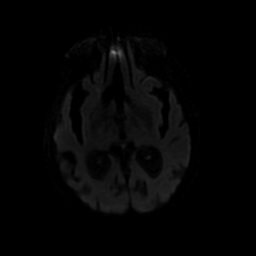
[im 60/90]
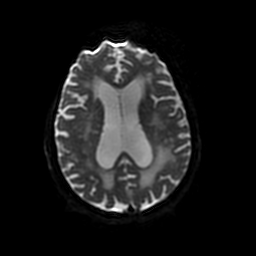
[im 75/90]
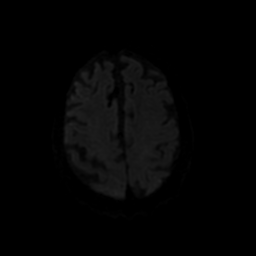
[im 90/90]
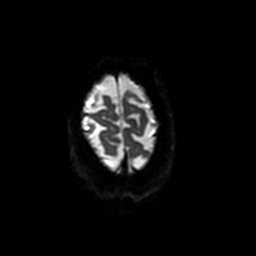

[Series 5: FLAIR · sagittal · 5.0mm · 0.47mm/px · 2 of 23 slices shown (1 of 2)]
[im 1/23]
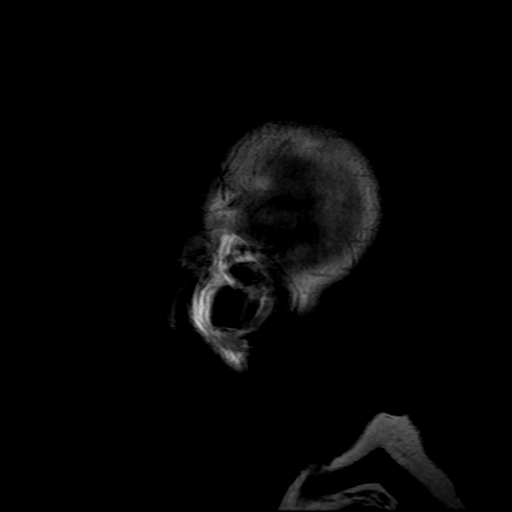
[im 23/23]
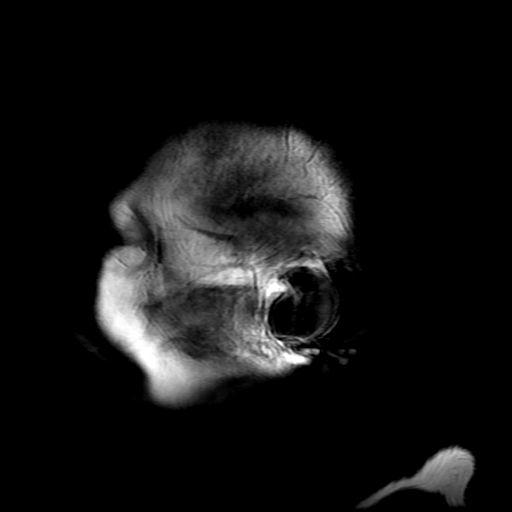

[Series 6: T2 · axial · 5.0mm · 0.47mm/px · z∈[-157,-14]mm · 2 of 25 slices shown]
[im 1/25]
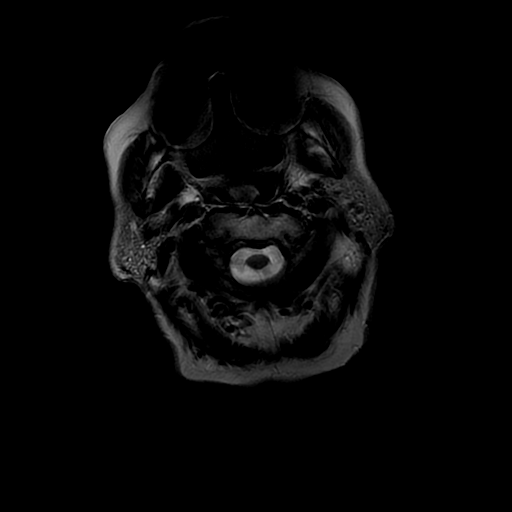
[im 25/25]
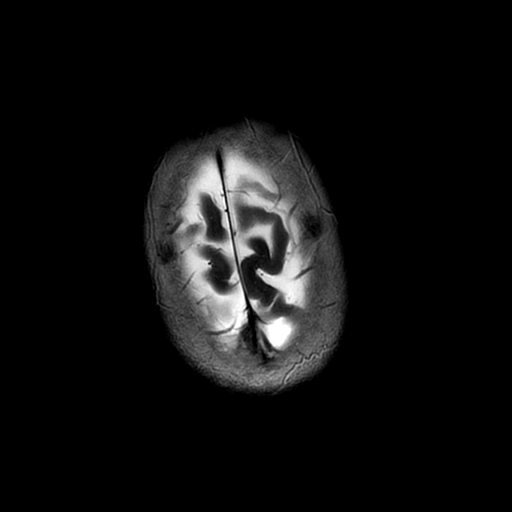

[Series 7: FLAIR · axial · 5.0mm · 0.47mm/px · z∈[-157,-14]mm · 2 of 25 slices shown (2 of 2)]
[im 1/25]
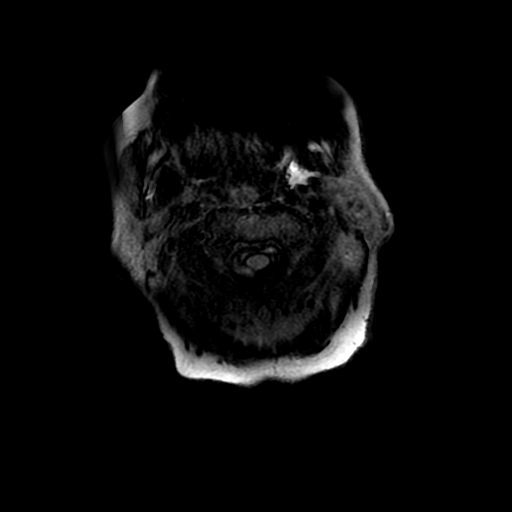
[im 25/25]
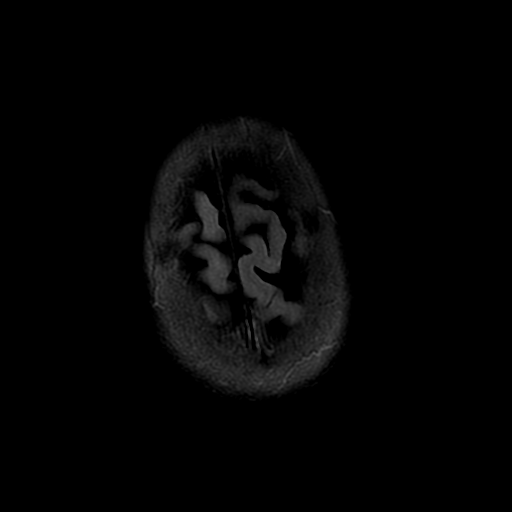

[Series 8: DWI · coronal · 5.0mm · 0.94mm/px · 6 of 64 slices shown (2 of 4)]
[im 1/64]
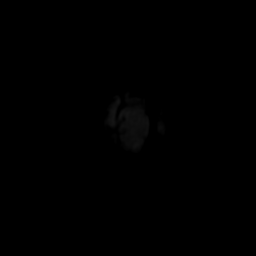
[im 13/64]
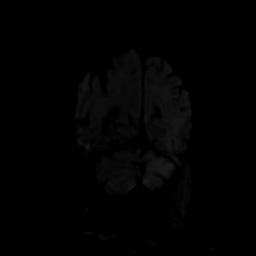
[im 26/64]
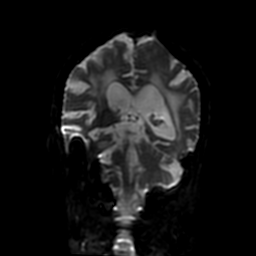
[im 38/64]
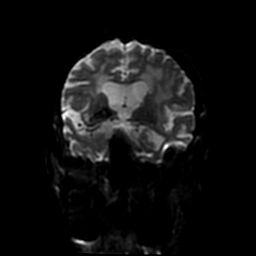
[im 51/64]
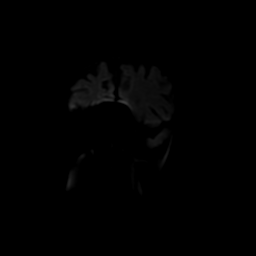
[im 64/64]
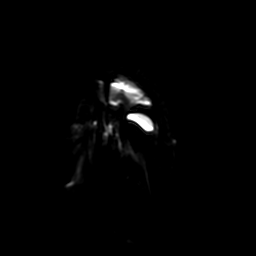

[Series 9: (person_name) · axial · 3.0mm · 0.47mm/px · z∈[-147,-19]mm · 7 of 100 slices shown]
[im 1/100]
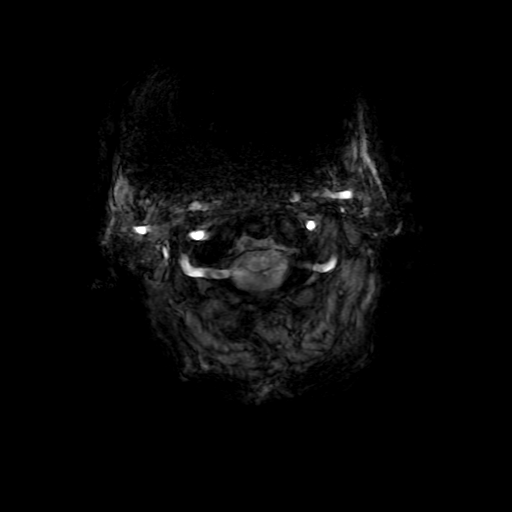
[im 13/100]
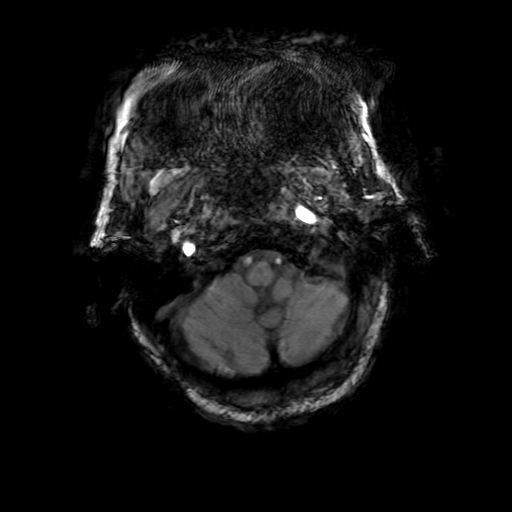
[im 25/100]
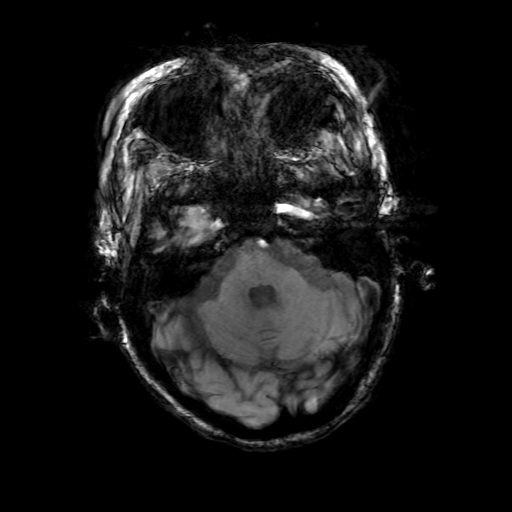
[im 38/100]
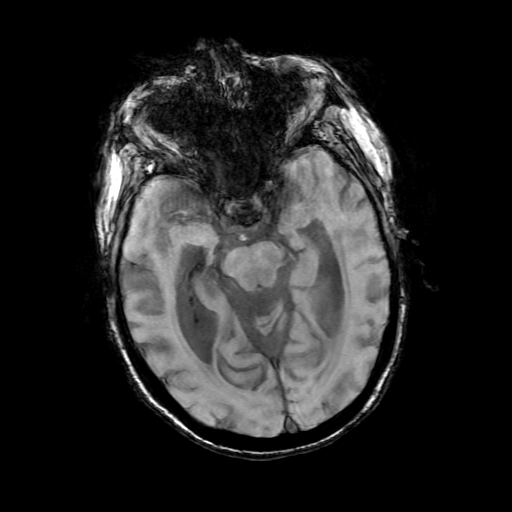
[im 62/100]
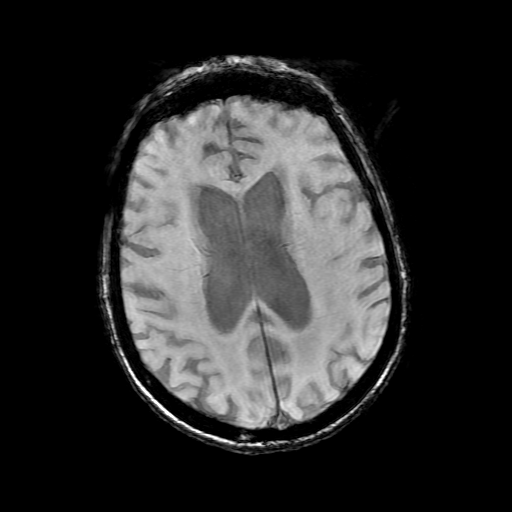
[im 75/100]
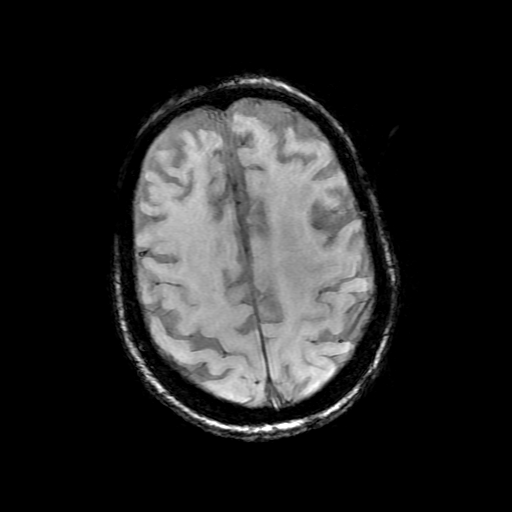
[im 87/100]
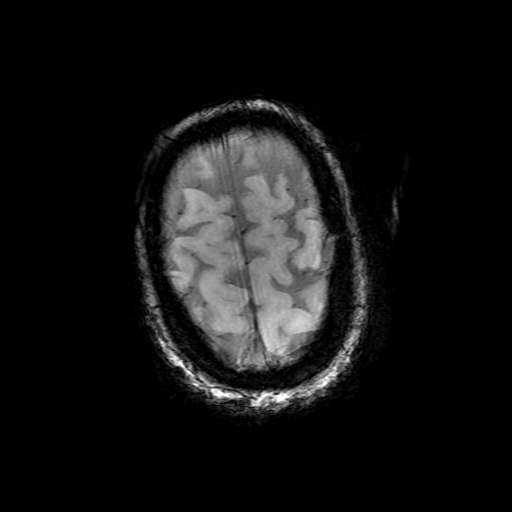

[Series 400: DWI · axial · 3.0mm · 0.94mm/px · z∈[-150,-18]mm · 4 of 45 slices shown (3 of 4)]
[im 1/45]
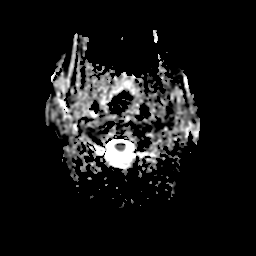
[im 15/45]
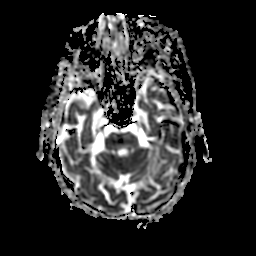
[im 30/45]
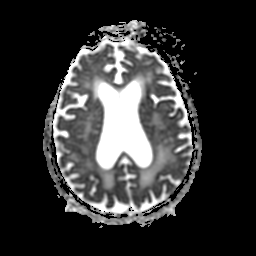
[im 45/45]
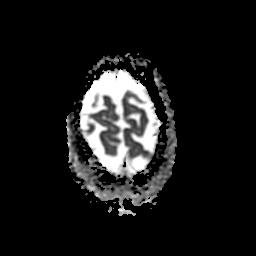

[Series 800: DWI · coronal · 5.0mm · 0.94mm/px · 3 of 32 slices shown (4 of 4)]
[im 1/32]
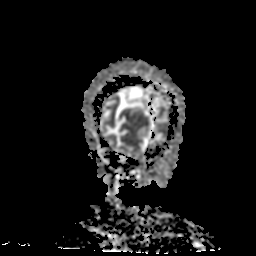
[im 16/32]
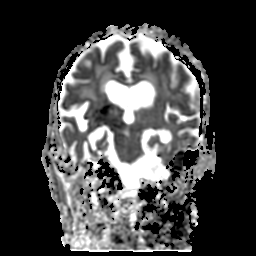
[im 32/32]
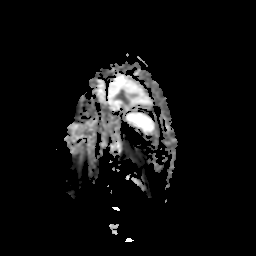

[33 of 48 positions shown; findings below may reference images not displayed]

FINDINGS: No reduced diffusion to suggest acute ischemia. No susceptibility
artifact to suggest hemorrhage.

Ventricles and sulci are normal for patient's age. Confluent
supratentorial white matter FLAIR T2 hyperintensities, patchy
pontine T2 hyperintensities without midline shift, mass effect or
mass lesions on this mildly motion degraded examination. Tiny T2
hyperintensities in the basal ganglia and thalamus favor
perivascular spaces.

Symmetric mildly prominent posterior fossa extra-axial cerebral
spinal fluid spaces suggests underlying atrophy, less likely chronic
subdural hematoma/hygromas. Normal major intracranial vascular flow
voids seen at the skull base. Status post bilateral ocular lens
implants. Trace maxillary sinus mucosal thickening without air-fluid
levels. The mastoid air cells are well aerated. Moderate
temporomandibular osteoarthrosis. No abnormal sellar expansion. No
cerebellar tonsillar ectopia. No suspicious calvarial bone marrow
signal.
IMPRESSION: No acute intracranial process on this mildly motion degraded
examination.

Involutional changes. Severe white matter changes compatible chronic
small vessel ischemic disease .
